# Patient Record
Sex: Male | Born: 1995 | Hispanic: No | Marital: Married | Smoking: Never smoker
Health system: Southern US, Community
[De-identification: ages and names within clinical notes are randomized; demographics above are authoritative.]

## PROBLEM LIST (undated history)

## (undated) DIAGNOSIS — K219 Gastro-esophageal reflux disease without esophagitis: Secondary | ICD-10-CM

## (undated) HISTORY — DX: Gastro-esophageal reflux disease without esophagitis: K21.9

## (undated) HISTORY — PX: WISDOM TOOTH EXTRACTION: SHX21

## (undated) HISTORY — PX: ROTATOR CUFF REPAIR: SHX139

---
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
## 2017-04-07 ENCOUNTER — Ambulatory Visit (INDEPENDENT_AMBULATORY_CARE_PROVIDER_SITE_OTHER): Payer: Managed Care, Other (non HMO) | Admitting: Family Medicine

## 2017-04-07 ENCOUNTER — Encounter: Payer: Self-pay | Admitting: Family Medicine

## 2017-04-07 VITALS — BP 125/89 | HR 116 | Temp 101.1°F | Ht 68.0 in | Wt 153.0 lb

## 2017-04-07 DIAGNOSIS — R509 Fever, unspecified: Secondary | ICD-10-CM

## 2017-04-07 DIAGNOSIS — J101 Influenza due to other identified influenza virus with other respiratory manifestations: Secondary | ICD-10-CM | POA: Diagnosis not present

## 2017-04-07 DIAGNOSIS — F410 Panic disorder [episodic paroxysmal anxiety] without agoraphobia: Secondary | ICD-10-CM

## 2017-04-07 LAB — POCT INFLUENZA A/B
INFLUENZA B, POC: POSITIVE — AB
Influenza A, POC: NEGATIVE

## 2017-04-07 MED ORDER — ZANAMIVIR 5 MG/BLISTER IN AEPB: 2.0000 | INHALATION_SPRAY | Freq: Two times a day (BID) | RESPIRATORY_TRACT | 0 refills | Status: DC

## 2017-04-07 NOTE — Progress Notes (Signed)
Subjective:    Patient ID: Tommy Tapia, male    DOB: [birth year], 21 y.o.   MRN: [id number]  HPI Started feeling back last night with ST, chills sweating, muscle aches. Had temp to 101.4 around 11 AM today.  . Using advil Cold and sinus. Had flu shot about 3 weeks. 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  He feels like his legs and arms just felt weak today. He is here today with his girlfriend. Nobody else has been sick. He's had some mild nasal congestion and some slight cough today.  Review of Systems     Objective:   Physical Exam  Constitutional: He is oriented to person, place, and time. He appears well-developed and well-nourished.  HENT:  Head: Normocephalic and atraumatic.  Right Ear: External ear normal.  Left Ear: External ear normal.  Nose: Nose normal.  Mouth/Throat: Oropharynx is clear and moist.  TMs and canals are clear.   Eyes: Conjunctivae and EOM are normal. Pupils are equal, round, and reactive to light.  Neck: Neck supple. No thyromegaly present.  Cardiovascular: Normal rate and normal heart sounds.  Pulmonary/Chest: Effort normal and breath sounds normal.  Lymphadenopathy:    He has no cervical adenopathy.  Neurological: He is alert and oriented to person, place, and time.  Skin: Skin is warm and dry.  Psychiatric: He has a normal mood and affect.      Assessment & Plan:  Influenza B-we'll treat with Relenza since had hallucinations with Tamiflu. Make sure hydrating well, Tylenol and ibuprofen to control fever. Rest. In good emergency department if symptoms are getting worse.  I did warn that the Relenza can also cause hallucinations of any point he feels like it is affecting how he is feeling or thinking then he needs to just stop it immediately.  Patient started to experience a panic attack before he left today. And that getting him hydroxyzine 10 mg and we had him stay and sit and try to rest for a while. He felt okay enough to walk out  of here and go back home. I also gave him ibuprofen 800 mg and Tylenol 1000 mg help bring down his fever and reduce his achiness. Blood pressure was rechecked and was about the same. Repeat pulse was 108 bpm and repeat temperature was 100F. He stay about 1.5 hours with [place].

## 2017-04-07 NOTE — Patient Instructions (Addendum)
Given 800mg  IBUprofen and 1000mg  of Tylenol at 1:50. Can dose 800mg  of IBUprofen again around 9:50 tonight.  Can dose 1000mg  of Tylenol around 7:50 tonight.      Influenza, Adult Influenza ("the flu") is an infection in the lungs, nose, and throat (respiratory tract). It is caused by a virus. The flu causes many common cold symptoms, as well as a high fever and body aches. It can make you feel very sick. The flu spreads easily from person to person (is contagious). Getting a flu shot (influenza vaccination) every year is the best way to prevent the flu. Follow these instructions at home:  Take over-the-counter and prescription medicines only as told by your doctor.  Use a cool mist humidifier to add moisture (humidity) to the air in your home. This can make it easier to breathe.  Rest as needed.  Drink enough fluid to keep your pee (urine) clear or pale yellow.  Cover your mouth and nose when you cough or sneeze.  Wash your hands with soap and water often, especially after you cough or sneeze. If you cannot use soap and water, use hand sanitizer.  Stay home from work or school as told by your doctor. Unless you are visiting your doctor, try to avoid leaving home until your fever has been gone for 24 hours without the use of medicine.  Keep all follow-up visits as told by your doctor. This is important. How is this prevented?  Getting a yearly (annual) flu shot is the best way to avoid getting the flu. You may get the flu shot in late summer, fall, or winter. Ask your doctor when you should get your flu shot.  Wash your hands often or use hand sanitizer often.  Avoid contact with people who are sick during cold and flu season.  Eat healthy foods.  Drink plenty of fluids.  Get enough sleep.  Exercise regularly. Contact a doctor if:  You get new symptoms.  You have: ? Chest pain. ? Watery poop (diarrhea). ? A fever.  Your cough gets worse.  You start to have more  mucus.  You feel sick to your stomach (nauseous).  You throw up (vomit). Get help right away if:  You start to be short of breath or have trouble breathing.  Your skin or nails turn a bluish color.  You have very bad pain or stiffness in your neck.  You get a sudden headache.  You get sudden pain in your face or ear.  You cannot stop throwing up. This information is not intended to replace advice given to you by your health care provider. Make sure you discuss any questions you have with your health care provider. Document Released: 02/04/2008 Document Revised: 10/03/2015 Document Reviewed: 02/19/2015 Elsevier Interactive Patient Education  2017 [employer]

## 2017-05-31 ENCOUNTER — Ambulatory Visit (INDEPENDENT_AMBULATORY_CARE_PROVIDER_SITE_OTHER): Payer: Managed Care, Other (non HMO) | Admitting: Family Medicine

## 2017-05-31 ENCOUNTER — Ambulatory Visit (INDEPENDENT_AMBULATORY_CARE_PROVIDER_SITE_OTHER): Payer: Managed Care, Other (non HMO)

## 2017-05-31 ENCOUNTER — Encounter: Payer: Self-pay | Admitting: Family Medicine

## 2017-05-31 VITALS — BP 136/80 | HR 91 | Temp 97.6°F | Ht 70.0 in | Wt 164.0 lb

## 2017-05-31 DIAGNOSIS — J0100 Acute maxillary sinusitis, unspecified: Secondary | ICD-10-CM

## 2017-05-31 DIAGNOSIS — M25512 Pain in left shoulder: Secondary | ICD-10-CM | POA: Diagnosis not present

## 2017-05-31 MED ORDER — IPRATROPIUM BROMIDE 0.06 % NA SOLN: 2.0000 | NASAL | 6 refills | Status: DC | PRN

## 2017-05-31 MED ORDER — PREDNISONE 10 MG PO TABS: 30.0000 mg | ORAL_TABLET | Freq: Every day | ORAL | 0 refills | Status: DC

## 2017-05-31 NOTE — Progress Notes (Signed)
Tommy Tapia is a 22 y.o. male who presents to [facility]: Primary Care Sports Medicine today for left shoulder pain and sinus pressure.   Tommy Tapia notes a 1 year history of left shoulder pain. The pain is present at night and with cross over arm activity and with rotating the shoulder when abducted and externally rotated.  He notes the pain has been worsening recently.  He denies any injury.  No radiating pain weakness or numbness fevers chills.  He has not tried any treatment yet.   Additionally he notes a one-week history of sinus pain and pressure associated with a scratchy throat.  Symptoms have worsened recently.  He is tried over-the-counter medications which have helped only a little.  He denies significant cough or shortness of breath.   No past medical history on file. No past surgical history on file. Social History   Tobacco Use  . Smoking status: Never Smoker  . Smokeless tobacco: Never Used  Substance Use Topics  . Alcohol use: No    Alcohol/week: 0.0 oz   family history is not on file.  ROS as above:  Medications: Current Outpatient Medications  Medication Sig Dispense Refill  . ipratropium (ATROVENT) 0.06 % nasal spray Place 2 sprays into both nostrils every 4 (four) hours as needed for rhinitis. 10 mL 6  . predniSONE (DELTASONE) 10 MG tablet Take 3 tablets (30 mg total) by mouth daily with breakfast. 15 tablet 0   No current facility-administered medications for this visit.    Allergies  Allergen Reactions  . Other     MEDICATION DYES  . Tamiflu [Oseltamivir Phosphate] Other (See Comments)    Hallucination    Health Maintenance Health Maintenance  Topic Date Due  . HIV Screening  04/21/2013  . TETANUS/TDAP  08/28/2018  . INFLUENZA VACCINE  Completed     Exam:  BP 136/80   Pulse 91   Temp 97.6 F (36.4 C) (Oral)   Ht 5\' 10"  (1.778 m)   Wt 164 lb  (74.4 kg)   BMI 23.53 kg/m  Gen: Well NAD HEENT: EOMI,  MMM normal posterior pharynx with cobblestoning.  Normal tympanic membranes bilaterally.  Clear nasal discharge. Lungs: Normal work of breathing. CTABL Heart: RRR no MRG Abd: NABS, Soft. Nondistended, Nontender Exts: Brisk capillary refill, warm and well perfused.  MSK: C-spine nontender to midline normal neck motion. Shoulder normal-appearing Tender to palpation AC joint. Normal shoulder motion. Positive crossover arm compression test. Negative Hawkins and Neer's test. Positive full can test negative empty can test. Positive O'Brien's test. Negative Yergason's and speeds test. Shoulder strength is intact. Negative grind and clunk test. Pulses capillary refill and sensation are intact.   No results found for this or any previous visit (from the past 72 hour(s)). Dg Shoulder Left  Result Date: 05/31/2017 CLINICAL DATA:  Left shoulder pain. EXAM: LEFT SHOULDER - 2+ VIEW COMPARISON:  None. FINDINGS: There is no evidence of fracture or dislocation. There is no evidence of arthropathy or other focal bone abnormality. Soft tissues are unremarkable. IMPRESSION: Negative. Electronically Signed   By: Irish Lack M.D.   On: 05/31/2017 14:24      Assessment and Plan: 22 y.o. male with  Sinusitis likely viral.  Based on symptoms failing to improve after reasonable trial of conservative management will treat with prednisone and Atrovent nasal spray.    Shoulder pain unclear etiology concerning for labrum tear versus AC joint issue.  X-ray is unremarkable.  Plan for trial of prednisone as noted above and home exercise program.  If not better likely next step would be injection versus physical therapy versus MRI.  Follow-up with PCP in a few weeks.   Orders Placed This Encounter  Procedures  . DG Shoulder Left    Standing Status:   Future    Number of Occurrences:   1    Standing Expiration Date:   07/30/2018    Order Specific  Question:   Reason for Exam (SYMPTOM  OR DIAGNOSIS REQUIRED)    Answer:   eval left shoulder pain    Order Specific Question:   Preferred imaging location?    [facility]    Order Specific Question:   Radiology Contrast Protocol - do NOT remove file path    Answer:   [email or web address]   Meds ordered this encounter  Medications  . predniSONE (DELTASONE) 10 MG tablet    Sig: Take 3 tablets (30 mg total) by mouth daily with breakfast.    Dispense:  15 tablet    Refill:  0  . ipratropium (ATROVENT) 0.06 % nasal spray    Sig: Place 2 sprays into both nostrils every 4 (four) hours as needed for rhinitis.    Dispense:  10 mL    Refill:  6     Discussed warning signs or symptoms. Please see discharge instructions. Patient expresses understanding.

## 2017-05-31 NOTE — Patient Instructions (Signed)
Thank you for coming in today. Take the prednisone for 5 days.  Use the nasal spray for runny nose.  Do the home exercises.  Up to the front, to the side and internal and external rotation.  Do about 30 reps twice daily.  Get xray now.  Follow up with Dr T in 4 weeks or so.  Continue over the counter cold medicines.   Call or go to the emergency room if you get worse, have trouble breathing, have chest pains, or palpitations.

## 2017-07-05 ENCOUNTER — Encounter: Payer: Self-pay | Admitting: Sports Medicine

## 2017-07-05 ENCOUNTER — Ambulatory Visit (INDEPENDENT_AMBULATORY_CARE_PROVIDER_SITE_OTHER): Payer: Managed Care, Other (non HMO) | Admitting: Sports Medicine

## 2017-07-05 ENCOUNTER — Ambulatory Visit (INDEPENDENT_AMBULATORY_CARE_PROVIDER_SITE_OTHER): Payer: Managed Care, Other (non HMO)

## 2017-07-05 DIAGNOSIS — M5412 Radiculopathy, cervical region: Secondary | ICD-10-CM | POA: Diagnosis not present

## 2017-07-05 DIAGNOSIS — M25512 Pain in left shoulder: Secondary | ICD-10-CM

## 2017-07-05 DIAGNOSIS — B354 Tinea corporis: Secondary | ICD-10-CM

## 2017-07-05 MED ORDER — PREDNISONE 50 MG PO TABS: ORAL_TABLET | ORAL | 0 refills | Status: DC

## 2017-07-05 MED ORDER — CLOTRIMAZOLE-BETAMETHASONE 1-0.05 % EX CREA: 1.0000 "application " | TOPICAL_CREAM | Freq: Two times a day (BID) | CUTANEOUS | 0 refills | Status: DC

## 2017-07-05 NOTE — Assessment & Plan Note (Signed)
Left C7 distribution. Prednisone, rehab exercises, x-rays. Return in 1 month, MR for interventional planning if no better.

## 2017-07-05 NOTE — Assessment & Plan Note (Signed)
Adding topical Lotrisone 

## 2017-07-05 NOTE — Progress Notes (Signed)
  Subjective:    CC: Skin rash, neck and shoulder pain  HPI: Skin rash: Present for a few weeks, there is a circular rash on his left deltoid.  Only minimally pruritic, no pain.  Shoulder pain: Left-sided, works in grocery store handling lots of money, pain is in the neck with radiation to the posterior left scalp as well as down the left arm to the first through third fingers.  Worse with prolonged downgaze.  Moderate, persistent, no progressive weakness, no trauma, no constitutional symptoms.  I reviewed the past medical history, family history, social history, surgical history, and allergies today and no changes were needed.  Please see the problem list section below in epic for further details.  Past Medical History: No past medical history on file. Past Surgical History: No past surgical history on file. Social History: Social History   Socioeconomic History  . Marital status: Single    Spouse name: None  . Number of children: None  . Years of education: None  . Highest education level: None  Social Needs  . Financial resource strain: None  . Food insecurity - worry: None  . Food insecurity - inability: None  . Transportation needs - medical: None  . Transportation needs - non-medical: None  Occupational History  . None  Tobacco Use  . Smoking status: Never Smoker  . Smokeless tobacco: Never Used  Substance and Sexual Activity  . Alcohol use: No    Alcohol/week: 0.0 oz  . Drug use: None  . Sexual activity: None  Other Topics Concern  . None  Social History Narrative  . None   Family History: No family history on file. Allergies: Allergies  Allergen Reactions  . Other     MEDICATION DYES  . Tamiflu [Oseltamivir Phosphate] Other (See Comments)    Hallucination   Medications: See med rec.  Review of Systems: No fevers, chills, night sweats, weight loss, chest pain, or shortness of breath.   Objective:    General: Well Developed, well nourished, and in no  acute distress.  Neuro: Alert and oriented x3, extra-ocular muscles intact, sensation grossly intact.  HEENT: Normocephalic, atraumatic, pupils equal round reactive to light, neck supple, no masses, no lymphadenopathy, thyroid nonpalpable.  Skin: Warm and dry, on the left deltoid there is a circular, scaly leading edged lesion consistent with tinea corporis Cardiac: Regular rate and rhythm, no murmurs rubs or gallops, no lower extremity edema.  Respiratory: Clear to auscultation bilaterally. Not using accessory muscles, speaking in full sentences. Neck: Negative spurling's Full neck range of motion Grip strength and sensation normal in bilateral hands Strength good C4 to T1 distribution No sensory change to C4 to T1 Reflexes normal  Impression and Recommendations:    Radiculitis of left cervical region Left C7 distribution. Prednisone, rehab exercises, x-rays. Return in 1 month, MR for interventional planning if no better.  Tinea corporis Adding topical Lotrisone. ___________________________________________ Ihor Austinhomas J. Benjamin Stainhekkekandam, M.D., ABFM., CAQSM. Primary Care and Sports Medicine [facility]  Adjunct Instructor of Family Medicine  [employer]'

## 2017-08-02 ENCOUNTER — Ambulatory Visit: Payer: Managed Care, Other (non HMO) | Admitting: Sports Medicine

## 2017-08-02 DIAGNOSIS — Z0189 Encounter for other specified special examinations: Secondary | ICD-10-CM

## 2018-05-27 ENCOUNTER — Ambulatory Visit (INDEPENDENT_AMBULATORY_CARE_PROVIDER_SITE_OTHER): Payer: 59 | Admitting: Physician Assistant

## 2018-05-27 ENCOUNTER — Encounter: Payer: Self-pay | Admitting: Physician Assistant

## 2018-05-27 VITALS — BP 143/81 | HR 72 | Temp 97.7°F | Wt 179.0 lb

## 2018-05-27 DIAGNOSIS — R6889 Other general symptoms and signs: Secondary | ICD-10-CM

## 2018-05-27 DIAGNOSIS — J060 Acute laryngopharyngitis: Secondary | ICD-10-CM | POA: Diagnosis not present

## 2018-05-27 LAB — POCT RAPID STREP A (OFFICE): Rapid Strep A Screen: NEGATIVE

## 2018-05-27 LAB — POCT INFLUENZA A/B
Influenza A, POC: NEGATIVE
Influenza B, POC: NEGATIVE

## 2018-05-27 MED ORDER — PREDNISONE 20 MG PO TABS: 40.0000 mg | ORAL_TABLET | Freq: Every day | ORAL | 0 refills | Status: AC

## 2018-05-27 NOTE — Progress Notes (Signed)
HPI:                                                                Tommy Tapia is a 23 y.o. male who presents to [name]: Primary Care Sports Medicine today for sore throat  Sore Throat   This is a new problem. The current episode started today. The problem has been unchanged. Neither side of throat is experiencing more pain than the other. There has been no fever. The pain is moderate. Associated symptoms include swollen glands. Pertinent negatives include no abdominal pain, congestion, coughing, drooling, ear pain, headaches, neck pain, trouble swallowing or vomiting. He has had no exposure to strep. He has tried nothing for the symptoms.  Reports he is in the [employer] and he was [place] this [place], and he received several vaccinations.    History reviewed. No pertinent past medical history. History reviewed. No pertinent surgical history. Social History   Tobacco Use  . Smoking status: Never Smoker  . Smokeless tobacco: Never Used  Substance Use Topics  . Alcohol use: No    Alcohol/week: 0.0 standard drinks   family history is not on file.    ROS: Review of Systems  Constitutional: Positive for diaphoresis and fatigue. Negative for chills and fever.  HENT: Positive for sore throat. Negative for congestion, drooling, ear pain and trouble swallowing.   Respiratory: Negative for cough.   Gastrointestinal: Negative for abdominal pain, nausea and vomiting.  Musculoskeletal: Negative for arthralgias and neck pain.  Skin: Negative for rash.  Neurological: Negative for headaches.     Medications: Current Outpatient Medications  Medication Sig Dispense Refill  . predniSONE (DELTASONE) 20 MG tablet Take 2 tablets (40 mg total) by mouth daily with breakfast for 5 days. 10 tablet 0   No current facility-administered medications for this visit.    Allergies  Allergen Reactions  . Other     MEDICATION DYES  . Tamiflu [Oseltamivir  Phosphate] Other (See Comments)    Hallucination       Objective:  BP (!) 143/81   Pulse 72   Temp 97.7 F (36.5 C) (Oral)   Wt 179 lb (81.2 kg)   SpO2 96%   BMI 25.68 kg/m  Gen:  alert, not ill-appearing, no distress, appropriate for age HEENT: head normocephalic without obvious abnormality, conjunctiva and cornea clear, wearing glasses, tympanic membranes pearly gray and semitransparent bilaterally, external ear canals normal, nasal mucosa pink, oropharynx with mild left-sided redness, no swelling or exudates on the tonsils, uvula midline, neck supple, bilateral tender tonsillar adenopathy, neck supple, trachea midline Pulm: Normal work of breathing, voice is hoarse, clear to auscultation bilaterally, no wheezes, rales or rhonchi CV: Normal rate, regular rhythm, s1 and s2 distinct, no murmurs, clicks or rubs  Neuro: alert and oriented x 3, no tremor MSK: extremities atraumatic, normal gait and station Skin: intact, no rashes on exposed skin, no jaundice, no cyanosis    Results for orders placed or performed in visit on 05/27/18 (from the past 72 hour(s))  POCT Influenza A/B     Status: Normal   Collection Time: 05/27/18  3:21 PM  Result Value Ref Range   Influenza A, POC Negative Negative   Influenza B, POC Negative Negative  POCT  rapid strep A     Status: Normal   Collection Time: 05/27/18  3:28 PM  Result Value Ref Range   Rapid Strep A Screen Negative Negative   No results found.    Assessment and Plan: 23 y.o. male with    .Diagnoses and all orders for this visit:  Flu-like symptoms -     POCT Influenza A/B  Laryngopharyngitis -     predniSONE (DELTASONE) 20 MG tablet; Take 2 tablets (40 mg total) by mouth daily with breakfast for 5 days. -     POCT rapid strep A   Afebrile, no tachypnea, no tachycardia Point-of-care influenza AB and strep a both negative today Counseled on supportive care for viral pharyngitis Work note provided for today  Patient  education and anticipatory guidance given Patient agrees with treatment plan Follow-up as needed if symptoms worsen or fail to improve  Levonne Hubert PA-C

## 2018-05-27 NOTE — Patient Instructions (Addendum)
For sore throat: - Prednisone daily for 5 days (Do not take any OTC NSAIDs during this time such as Aleve, Advil, Ibuprofen etc.) - Tylenol 1000mg  every 8 hours as needed for throat pain - Cepacol throat lozenges and/or Chloraseptic spray - Warm salt water gargles  Pharyngitis  Pharyngitis is redness, pain, and swelling (inflammation) of the throat (pharynx). It is a very common cause of sore throat. Pharyngitis can be caused by a bacteria, but it is usually caused by a virus. Most cases of pharyngitis get better on their own without treatment. What are the causes? This condition may be caused by:  Infection by viruses (viral). Viral pharyngitis spreads from person to person (is contagious) through coughing, sneezing, and sharing of personal items or utensils such as cups, forks, spoons, and toothbrushes.  Infection by bacteria (bacterial). Bacterial pharyngitis may be spread by touching the nose or face after coming in contact with the bacteria, or through more intimate contact, such as kissing.  Allergies. Allergies can cause buildup of mucus in the throat (post-nasal drip), leading to inflammation and irritation. Allergies can also cause blocked nasal passages, forcing breathing through the mouth, which dries and irritates the throat. What increases the risk? You are more likely to develop this condition if:  You are 955-21 years old.  You are exposed to crowded environments such as daycare, school, or dormitory living.  You live in a cold climate.  You have a weakened disease-fighting (immune) system. What are the signs or symptoms? Symptoms of this condition vary by the cause (viral, bacterial, or allergies) and can include:  Sore throat.  Fatigue.  Low-grade fever.  Headache.  Joint pain and muscle aches.  Skin rashes.  Swollen glands in the throat (lymph nodes).  Plaque-like film on the throat or tonsils. This is often a symptom of bacterial  pharyngitis.  Vomiting.  Stuffy nose (nasal congestion).  Cough.  Red, itchy eyes (conjunctivitis).  Loss of appetite. How is this diagnosed? This condition is often diagnosed based on your medical history and a physical exam. Your health care provider will ask you questions about your illness and your symptoms. A swab of your throat may be done to check for bacteria (rapid strep test). Other lab tests may also be done, depending on the suspected cause, but these are rare. How is this treated? This condition usually gets better in 3-4 days without medicine. Bacterial pharyngitis may be treated with antibiotic medicines. Follow these instructions at home:  Take over-the-counter and prescription medicines only as told by your health care provider. ? If you were prescribed an antibiotic medicine, take it as told by your health care provider. Do not stop taking the antibiotic even if you start to feel better. ? Do not give children aspirin because of the association with Reye syndrome.  Drink enough water and fluids to keep your urine clear or pale yellow.  Get a lot of rest.  Gargle with a salt-water mixture 3-4 times a day or as needed. To make a salt-water mixture, completely dissolve -1 tsp of salt in 1 cup of warm water.  If your health care provider approves, you may use throat lozenges or sprays to soothe your throat. Contact a health care provider if:  You have large, tender lumps in your neck.  You have a rash.  You cough up green, yellow-brown, or bloody spit. Get help right away if:  Your neck becomes stiff.  You drool or are unable to swallow liquids.  You  cannot drink or take medicines without vomiting.  You have severe pain that does not go away, even after you take medicine.  You have trouble breathing, and it is not caused by a stuffy nose.  You have new pain and swelling in your joints such as the knees, ankles, wrists, or elbows. Summary  Pharyngitis  is redness, pain, and swelling (inflammation) of the throat (pharynx).  While pharyngitis can be caused by a bacteria, the most common causes are viral.  Most cases of pharyngitis get better on their own without treatment.  Bacterial pharyngitis is treated with antibiotic medicines. This information is not intended to replace advice given to you by your health care provider. Make sure you discuss any questions you have with your health care provider. Document Released: 04/27/2005 Document Revised: 06/02/2016 Document Reviewed: 06/02/2016 Elsevier Interactive Patient Education  2019 [employer].

## 2018-07-28 ENCOUNTER — Ambulatory Visit (INDEPENDENT_AMBULATORY_CARE_PROVIDER_SITE_OTHER): Payer: 59 | Admitting: Sports Medicine

## 2018-07-28 ENCOUNTER — Encounter: Payer: Self-pay | Admitting: Sports Medicine

## 2018-07-28 ENCOUNTER — Other Ambulatory Visit: Payer: Self-pay

## 2018-07-28 DIAGNOSIS — Z8639 Personal history of other endocrine, nutritional and metabolic disease: Secondary | ICD-10-CM | POA: Insufficient documentation

## 2018-07-28 DIAGNOSIS — Z0000 Encounter for general adult medical examination without abnormal findings: Secondary | ICD-10-CM | POA: Insufficient documentation

## 2018-07-28 DIAGNOSIS — J302 Other seasonal allergic rhinitis: Secondary | ICD-10-CM | POA: Diagnosis not present

## 2018-07-28 MED ORDER — CETIRIZINE HCL 10 MG PO TABS: 10.0000 mg | ORAL_TABLET | Freq: Every day | ORAL | 11 refills | Status: DC

## 2018-07-28 NOTE — Assessment & Plan Note (Signed)
Annual physical exam with screening labs today.

## 2018-07-28 NOTE — Assessment & Plan Note (Signed)
Per patient report this was noted on an [employer] physical. We are going to do some confirmatory blood testing.

## 2018-07-28 NOTE — Progress Notes (Signed)
Subjective:    CC: Runny nose  HPI: Tommy Tapia is a healthy, pleasant 23 year old male, he has a runny nose every time this year.  Because of the current drama with the current outbreak his employer has asked him to come and be examined and cleared to return to work.  He takes Zyrtec, and this controls his symptoms extremely well.  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  I reviewed the past medical history, family history, social history, surgical history, and allergies today and no changes were needed.  Please see the problem list section below in epic for further details.  Past Medical History: No past medical history on file. Past Surgical History: No past surgical history on file. Social History: Social History   Socioeconomic History  . Marital status: Single    Spouse name: Not on file  . Number of children: Not on file  . Years of education: Not on file  . Highest education level: Not on file  Occupational History  . Not on file  Social Needs  . Financial resource strain: Not on file  . Food insecurity:    Worry: Not on file    Inability: Not on file  . Transportation needs:    Medical: Not on file    Non-medical: Not on file  Tobacco Use  . Smoking status: Never Smoker  . Smokeless tobacco: Never Used  Substance and Sexual Activity  . Alcohol use: No    Alcohol/week: 0.0 standard drinks  . Drug use: Not on file  . Sexual activity: Not on file  Lifestyle  . Physical activity:    Days per week: Not on file    Minutes per session: Not on file  . Stress: Not on file  Relationships  . Social connections:    Talks on phone: Not on file    Gets together: Not on file    Attends religious service: Not on file    Active member of club or organization: Not on file    Attends meetings of clubs or organizations: Not on file    Relationship status: Not on file   Other Topics Concern  . Not on file  Social History Narrative  . Not on file   Family History: No family history on file. Allergies: Allergies  Allergen Reactions  . Other     MEDICATION DYES  . Tamiflu [Oseltamivir Phosphate] Other (See Comments)    Hallucination   Medications: See med rec.  Review of Systems: No fevers, chills, night sweats, weight loss, chest pain, or shortness of breath.   Objective:    General: Well Developed, well nourished, and in no acute distress.  Neuro: Alert and oriented x3, extra-ocular muscles intact, sensation grossly intact. Cranial nerves II through XII are intact, motor, sensory, and coordinative functions are all intact. HEENT: Normocephalic, atraumatic, pupils equal round reactive to light, neck supple, no masses, no lymphadenopathy, thyroid nonpalpable. Oropharynx, nasopharynx, external ear canals are unremarkable. Skin: Warm and dry, no rashes noted.  Cardiac: Regular rate and rhythm, no murmurs rubs or gallops.  Respiratory: Clear to auscultation bilaterally. Not using accessory muscles, speaking in full sentences.  Abdominal: Soft, nontender, nondistended, positive bowel sounds, no masses, no organomegaly.  Musculoskeletal: Shoulder, elbow, wrist, hip, knee, ankle stable, and with full range of motion.  Impression and Recommendations:    Annual physical exam Annual physical exam with screening labs today.  Seasonal allergic rhinitis Does well with Zyrtec. No evidence of a viral or bacterial illness.  H/O glucose-6-phosphatase deficiency (G6PD) Per patient report this was noted on an [employer] physical. We are going to do some confirmatory blood testing.   ___________________________________________ Ihor Austin. Benjamin Stain, M.D., ABFM., CAQSM. Primary Care and Sports Medicine [facility]  Adjunct Professor of Family Medicine  [employer]

## 2018-07-28 NOTE — Assessment & Plan Note (Addendum)
Does well with Zyrtec. No evidence of a viral or bacterial illness.

## 2018-07-29 LAB — CBC
HCT: 46.2 % (ref 38.5–50.0)
Hemoglobin: 15.8 g/dL (ref 13.2–17.1)
MCH: 30.7 pg (ref 27.0–33.0)
MCHC: 34.2 g/dL (ref 32.0–36.0)
MCV: 89.9 fL (ref 80.0–100.0)
MPV: 12.3 fL (ref 7.5–12.5)
Platelets: 240 Thousand/uL (ref 140–400)
RBC: 5.14 Million/uL (ref 4.20–5.80)
RDW: 11.8 % (ref 11.0–15.0)
WBC: 6.5 10*3/uL (ref 3.8–10.8)

## 2018-07-29 LAB — LIPID PANEL W/REFLEX DIRECT LDL
Cholesterol: 152 mg/dL (ref <200)
HDL: 53 mg/dL (ref >=40)
LDL Cholesterol (Calc): 86 mg/dL
Non-HDL Cholesterol (Calc): 99 mg/dL (ref <130)
Total CHOL/HDL Ratio: 2.9 (calc) (ref <5.0)
Triglycerides: 52 mg/dL (ref <150)

## 2018-07-29 LAB — COMPREHENSIVE METABOLIC PANEL
AG Ratio: 1.6 (calc) (ref 1.0–2.5)
ALT: 20 U/L (ref 9–46)
AST: 26 U/L (ref 10–40)
Albumin: 4.8 g/dL (ref 3.6–5.1)
CO2: 27 mmol/L (ref 20–32)
Calcium: 9.6 mg/dL (ref 8.6–10.3)
Glucose, Bld: 114 mg/dL — ABNORMAL HIGH (ref 65–99)
Potassium: 4.3 mmol/L (ref 3.5–5.3)

## 2018-07-29 LAB — TSH: TSH: 1.12 m[IU]/L (ref 0.40–4.50)

## 2018-07-29 LAB — COMPREHENSIVE METABOLIC PANEL WITH GFR
Alkaline phosphatase (APISO): 59 U/L (ref 36–130)
BUN: 15 mg/dL (ref 7–25)
Chloride: 103 mmol/L (ref 98–110)
Creat: 1.15 mg/dL (ref 0.60–1.35)
Globulin: 3 g/dL (ref 1.9–3.7)
Sodium: 139 mmol/L (ref 135–146)
Total Bilirubin: 1.8 mg/dL — ABNORMAL HIGH (ref 0.2–1.2)
Total Protein: 7.8 g/dL (ref 6.1–8.1)

## 2018-07-29 LAB — GLUCOSE 6 PHOSPHATE DEHYDROGENASE: G-6PDH: 3.5 U/g{Hb} — ABNORMAL LOW (ref 7.0–20.5)

## 2018-07-29 LAB — VITAMIN D 25 HYDROXY (VIT D DEFICIENCY, FRACTURES): Vit D, 25-Hydroxy: 26 ng/mL — ABNORMAL LOW (ref 30–100)

## 2018-07-29 MED ORDER — VITAMIN D (ERGOCALCIFEROL) 1.25 MG (50000 UNIT) PO CAPS: 50000.0000 [IU] | ORAL_CAPSULE | ORAL | 0 refills | Status: DC

## 2018-07-29 NOTE — Addendum Note (Signed)
Addended by: [name] on: 07/29/2018 06:06 AM   Modules accepted: Orders

## 2019-03-08 ENCOUNTER — Emergency Department (INDEPENDENT_AMBULATORY_CARE_PROVIDER_SITE_OTHER)
Admission: EM | Admit: 2019-03-08 | Discharge: 2019-03-08 | Disposition: A | Discharge status: Home / Self Care | Payer: Managed Care, Other (non HMO) | Source: Home / Self Care

## 2019-03-08 ENCOUNTER — Other Ambulatory Visit: Payer: Self-pay

## 2019-03-08 DIAGNOSIS — R05 Cough: Secondary | ICD-10-CM

## 2019-03-08 DIAGNOSIS — R5383 Other fatigue: Secondary | ICD-10-CM

## 2019-03-08 DIAGNOSIS — R6889 Other general symptoms and signs: Secondary | ICD-10-CM

## 2019-03-08 DIAGNOSIS — Z1152 Encounter for screening for COVID-19: Secondary | ICD-10-CM

## 2019-03-08 DIAGNOSIS — J039 Acute tonsillitis, unspecified: Secondary | ICD-10-CM

## 2019-03-08 DIAGNOSIS — Z20828 Contact with and (suspected) exposure to other viral communicable diseases: Secondary | ICD-10-CM

## 2019-03-08 DIAGNOSIS — J029 Acute pharyngitis, unspecified: Secondary | ICD-10-CM | POA: Diagnosis not present

## 2019-03-08 DIAGNOSIS — M791 Myalgia, unspecified site: Secondary | ICD-10-CM | POA: Diagnosis not present

## 2019-03-08 DIAGNOSIS — Z20822 Contact with and (suspected) exposure to covid-19: Secondary | ICD-10-CM

## 2019-03-08 LAB — POCT INFLUENZA A/B
Influenza A, POC: NEGATIVE
Influenza B, POC: NEGATIVE

## 2019-03-08 LAB — POCT RAPID STREP A (OFFICE): Rapid Strep A Screen: NEGATIVE

## 2019-03-08 MED ORDER — PREDNISONE 50 MG PO TABS: 60.0000 mg | ORAL_TABLET | Freq: Once | ORAL | Status: DC

## 2019-03-08 MED ORDER — ACETAMINOPHEN 325 MG PO TABS
650.0000 mg | ORAL_TABLET | Freq: Once | ORAL | Status: AC
  Administered 2019-03-08: 650 mg via ORAL

## 2019-03-08 MED ORDER — PREDNISONE 50 MG PO TABS: 50.0000 mg | ORAL_TABLET | Freq: Every day | ORAL | 0 refills | Status: AC

## 2019-03-08 MED ORDER — PREDNISONE 20 MG PO TABS
40.0000 mg | ORAL_TABLET | Freq: Once | ORAL | Status: AC
  Administered 2019-03-08: 14:00:00 40 mg via ORAL

## 2019-03-08 MED ORDER — IBUPROFEN 600 MG PO TABS: 600.0000 mg | ORAL_TABLET | Freq: Four times a day (QID) | ORAL | 0 refills | Status: DC | PRN

## 2019-03-08 NOTE — Discharge Instructions (Signed)
°  You may take 500mg  acetaminophen every 4-6 hours or in combination with ibuprofen 400-600mg  every 6-8 hours as needed for pain, inflammation, and fever.  Be sure to well hydrated with clear liquids and get at least 8 hours of sleep at night, preferably more while sick.   Please follow up with family medicine in 1 week if needed.  Due to concern for possibly having Covid-19, it is advised that you self-isolate at home until test results come back.  If positive, it is recommended you stay isolated for at least 10 days after symptom onset and 24 hours after last fever without taking medication (whichever is longer).  If you MUST go out, please wear a mask at all times, limit contact with others.   Most results have been coming back within about 2-5 days.   If your results are negative, you will NOT be receiving a phone call. You may check your MyChart account, please see in this packet how to set on up if you do not already have one. There is also an app for phones you can download.   You WILL be notified for POSITIVE results.

## 2019-03-08 NOTE — ED Provider Notes (Signed)
[facility]    CSN: [id number] Arrival date & time: 03/08/19  1210      History   Chief Complaint Chief Complaint  Patient presents with  . Fever  . Generalized Body Aches    HPI Tommy Tapia is a 23 y.o. male.   HPI Tommy Tapia is a 23 y.o. male presenting to UC with c/o not feeling well since yesterday, associated body aches, fatigue, mild congestion, minimal cough, minimal sore throat. Denies fever, chills, n/v/d. Pt states he feels the same way he normally does with the flu every year.  No known sick contacts. Denies n/v/d.  No chest pain or SOB.   History reviewed. No pertinent past medical history.  Patient Active Problem List   Diagnosis Date Noted  . Annual physical exam 07/28/2018  . Seasonal allergic rhinitis 07/28/2018  . 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 No pertinent surgical history.     Home Medications    Prior to Admission medications   Medication Sig Start Date End Date Taking? Authorizing Provider  cetirizine (ZYRTEC) 10 MG tablet Take 1 tablet (10 mg total) by mouth daily. 07/28/18   [name], MD  ibuprofen (ADVIL) 600 MG tablet Take 1 tablet (600 mg total) by mouth every 6 (six) hours as needed. 03/08/19   Lurene Shadow, PA-C  predniSONE (DELTASONE) 50 MG tablet Take 1 tablet (50 mg total) by mouth daily with breakfast for 5 days. 03/08/19 03/13/19  Lurene Shadow, PA-C  Vitamin D, Ergocalciferol, (DRISDOL) 1.25 MG (50000 UT) CAPS capsule Take 1 capsule (50,000 Units total) by mouth every 7 (seven) days. Take for 8 total doses(weeks) 07/29/18   [name], MD    Family History History reviewed. No pertinent family history.  Social History Social History   Tobacco Use  . Smoking status: Never Smoker  . Smokeless tobacco: Never Used  Substance Use Topics  . Alcohol use: No    Alcohol/week: 0.0 standard drinks  . Drug use: Not on file      Allergies   Other and Tamiflu [oseltamivir phosphate]   Review of Systems Review of Systems  Constitutional: Positive for chills, fatigue and fever.  HENT: Positive for congestion and sore throat. Negative for ear pain, trouble swallowing and voice change.   Respiratory: Positive for cough. Negative for shortness of breath.   Cardiovascular: Negative for chest pain and palpitations.  Gastrointestinal: Negative for abdominal pain, diarrhea, nausea and vomiting.  Musculoskeletal: Positive for arthralgias, back pain and myalgias.  Skin: Negative for rash.  Neurological: Positive for headaches. Negative for light-headedness.     Physical Exam Triage Vital Signs ED Triage Vitals  Enc Vitals Group     BP 03/08/19 1228 (!) 164/82     Pulse Rate 03/08/19 1228 (!) 102     Resp 03/08/19 1228 20     Temp 03/08/19 1228 99.2 F (37.3 C)     Temp Source 03/08/19 1228 Oral     SpO2 03/08/19 1228 96 %     Weight 03/08/19 1231 170 lb (77.1 kg)     Height 03/08/19 1231 5\' 9"  (1.753 m)     Head Circumference --      Peak Flow --      Pain Score 03/08/19 1230 7     Pain Loc --      Pain Edu? --      Excl. in [place]? --    No data found.  Updated Vital  Signs BP (!) 164/82 (BP Location: Right Arm)   Pulse (!) 102   Temp 99.2 F (37.3 C) (Oral)   Resp 20   Ht 5\' 9"  (1.753 m)   Wt 170 lb (77.1 kg)   SpO2 96%   BMI 25.10 kg/m   Visual Acuity Right Eye Distance:   Left Eye Distance:   Bilateral Distance:    Right Eye Near:   Left Eye Near:    Bilateral Near:     Physical Exam Vitals signs and nursing note reviewed.  Constitutional:      Appearance: Normal appearance. He is well-developed.  HENT:     Head: Normocephalic and atraumatic.     Right Ear: Tympanic membrane normal.     Left Ear: Tympanic membrane normal.     Nose: Nose normal.     Right Sinus: No maxillary sinus tenderness or frontal sinus tenderness.     Left Sinus: No maxillary sinus tenderness or frontal sinus  tenderness.     Mouth/Throat:     Lips: Pink.     Mouth: Mucous membranes are moist.     Pharynx: Oropharynx is clear. Uvula midline. Posterior oropharyngeal erythema present.     Tonsils: Tonsillar exudate present. No tonsillar abscesses. 3+ on the right. 3+ on the left.  Neck:     Musculoskeletal: Normal range of motion.  Cardiovascular:     Rate and Rhythm: Normal rate and regular rhythm.  Pulmonary:     Effort: Pulmonary effort is normal. No respiratory distress.     Breath sounds: Normal breath sounds. No stridor. No wheezing, rhonchi or rales.  Musculoskeletal: Normal range of motion.  Skin:    General: Skin is warm and dry.  Neurological:     Mental Status: He is alert and oriented to person, place, and time.  Psychiatric:        Behavior: Behavior normal.      UC Treatments / Results  Labs (all labs ordered are listed, but only abnormal results are displayed) Labs Reviewed  NOVEL CORONAVIRUS, NAA  POCT INFLUENZA A/B  POCT RAPID STREP A (OFFICE)    EKG   Radiology No results found.  Procedures Procedures (including critical care time)  Medications Ordered in UC Medications  acetaminophen (TYLENOL) tablet 650 mg (650 mg Oral Given 03/08/19 1341)  predniSONE (DELTASONE) tablet 40 mg (40 mg Oral Given 03/08/19 1342)    Initial Impression / Assessment and Plan / UC Course  I have reviewed the triage vital signs and the nursing notes.  Pertinent labs & imaging results that were available during my care of the patient were reviewed by me and considered in my medical decision making (see chart for details).    Rapid flu and strep: NEGATIVE Covid-19 test pending AVS provided along with work note  Final Clinical Impressions(s) / UC Diagnoses   Final diagnoses:  Flu-like symptoms  Acute tonsillitis, unspecified etiology  Encounter for screening laboratory testing for COVID-19 virus     Discharge Instructions      You may take 500mg  acetaminophen  every 4-6 hours or in combination with ibuprofen 400-600mg  every 6-8 hours as needed for pain, inflammation, and fever.  Be sure to well hydrated with clear liquids and get at least 8 hours of sleep at night, preferably more while sick.   Please follow up with family medicine in 1 week if needed.  Due to concern for possibly having Covid-19, it is advised that you self-isolate at home until test results come  back.  If positive, it is recommended you stay isolated for at least 10 days after symptom onset and 24 hours after last fever without taking medication (whichever is longer).  If you MUST go out, please wear a mask at all times, limit contact with others.   Most results have been coming back within about 2-5 days.   If your results are negative, you will NOT be receiving a phone call. You may check your MyChart account, please see in this packet how to set on up if you do not already have one. There is also an app for phones you can download.   You WILL be notified for POSITIVE results.      ED Prescriptions    Medication Sig Dispense Auth. Provider   predniSONE (DELTASONE) 50 MG tablet Take 1 tablet (50 mg total) by mouth daily with breakfast for 5 days. 5 tablet Waylan RocherPhelps, Riki Berninger O, PA-C   ibuprofen (ADVIL) 600 MG tablet Take 1 tablet (600 mg total) by mouth every 6 (six) hours as needed. 30 tablet Lurene ShadowPhelps, Renate Danh O, [place]-C     PDMP not reviewed this encounter.   Lurene Shadowhelps, Miki Blank O, [place] 03/08/19 1614

## 2019-03-08 NOTE — ED Triage Notes (Signed)
Woke up yesterday not feeling good.  Felt bad all day.  This morning feeling worse.  Started with body aches and fever.

## 2019-03-09 LAB — NOVEL CORONAVIRUS, NAA: SARS-CoV-2, NAA: NOT DETECTED

## 2019-03-21 ENCOUNTER — Telehealth: Payer: Self-pay | Admitting: Sports Medicine

## 2019-03-21 NOTE — Telephone Encounter (Signed)
I really need to discuss with him.  Virtual visits are quick and easy.

## 2019-03-21 NOTE — Telephone Encounter (Signed)
The patients mom called is having some sinus pressure, headache and some sinus drainage. He denies any productive cough.  He has been having the symptoms for over a week. He has not tried anything over the counter. Patient was offered a virtual visit and declined due to work schedule. He was seen in UC the end of last month. Does he need a visit or will you send something in to the pharmacy for him. Please advise.

## 2019-03-21 NOTE — Telephone Encounter (Signed)
Please call this patient to set up a virtual.

## 2019-03-22 ENCOUNTER — Ambulatory Visit (INDEPENDENT_AMBULATORY_CARE_PROVIDER_SITE_OTHER): Payer: Managed Care, Other (non HMO) | Admitting: Physician Assistant

## 2019-03-22 VITALS — Temp 97.5°F | Ht 69.0 in | Wt 170.0 lb

## 2019-03-22 DIAGNOSIS — J029 Acute pharyngitis, unspecified: Secondary | ICD-10-CM

## 2019-03-22 DIAGNOSIS — J0100 Acute maxillary sinusitis, unspecified: Secondary | ICD-10-CM

## 2019-03-22 MED ORDER — AMOXICILLIN-POT CLAVULANATE 875-125 MG PO TABS: 1.0000 | ORAL_TABLET | Freq: Two times a day (BID) | ORAL | 0 refills | Status: DC

## 2019-03-22 MED ORDER — FLUTICASONE PROPIONATE 50 MCG/ACT NA SUSP: 2.0000 | Freq: Every day | NASAL | 1 refills | Status: DC

## 2019-03-22 NOTE — Telephone Encounter (Signed)
I don't think you meant to route this back to me.

## 2019-03-22 NOTE — Telephone Encounter (Signed)
Appointment has been made. Wanted something for today, PCP did not have anything for today. Set up an appointment with Iran Planas, PA. No further questions at this time.

## 2019-03-22 NOTE — Progress Notes (Signed)
Patient ID: Tommy Tapia, male   DOB: 1996/03/03, 23 y.o.   MRN: [id number] .Marland KitchenVirtual Visit via Video Note  I connected with Ewing Fandino on 03/23/19 at  2:40 PM EST by a video enabled telemedicine application and verified that I am speaking with the correct person using two identifiers.  Location: Patient: home Provider: clinic   I discussed the limitations of evaluation and management by telemedicine and the availability of in person appointments. The patient expressed understanding and agreed to proceed.  History of Present Illness: Pt is a 23 yo male who calls into the clinic with sinus pressure, drainage, headache, facial pressure for last 4 days. 2 weeks ago he had fever, chills, body aches and went to UC. He was checked for strep, flu, and covid and all negative. Symptoms resolved with symptomatic care. A few days later he got a ST and now sinus pressure and drainage. No cough, SOB, loss of smell or taste, GI symptoms, fever, body aches. He is taking advil cold and sinus that is helping a little.   .. Active Ambulatory Problems    Diagnosis Date Noted  . Annual physical exam 07/28/2018  . Seasonal allergic rhinitis 07/28/2018  . 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 URI 07/14/2009  . Right knee pain 12/26/2014  . Costochondritis 02/27/2016  . Plica of knee, right 32/35/5732  . Influenza 06/15/2016  . Tinea corporis 07/05/2017  . Radiculitis of left cervical region 07/05/2017   No Additional Past Medical History      Observations/Objective: No acute distress.  No cough on encounter.  No labored breathing.  Normal mood and appearance.   .. Today's Vitals   03/22/19 1426  Temp: (!) 97.5 F (36.4 C)  TempSrc: Oral  Weight: 170 lb (77.1 kg)  Height: 5\' 9"  (1.753 m)   Body mass index is 25.1 kg/m.    Assessment and Plan: Marland KitchenMarland KitchenDiagnoses and all orders for this visit:  Acute  non-recurrent maxillary sinusitis -     amoxicillin-clavulanate (AUGMENTIN) 875-125 MG tablet; Take 1 tablet by mouth 2 (two) times daily. -     fluticasone (FLONASE) 50 MCG/ACT nasal spray; Place 2 sprays into both nostrils daily.  Sore throat   Seems very consisent with secondary sickening and sinusitis. Treated with augmentin and flonase. Due to COVID pandemic needs to be covid tested as well. Discussed 2 locations. Self isolate until results come back. Follow up as needed. Any worsening SOB go to ED.    Follow Up Instructions:    I discussed the assessment and treatment plan with the patient. The patient was provided an opportunity to ask questions and all were answered. The patient agreed with the plan and demonstrated an understanding of the instructions.   The patient was advised to call back or seek an in-person evaluation if the symptoms worsen or if the condition fails to improve as anticipated.    Iran Planas, PA-C

## 2019-03-22 NOTE — Progress Notes (Signed)
Symptoms started Sunday afternoon:   Sinus pressure  Headache Drainage  No cough/ loss of taste/smell/ no SOB  Has tried advil cold and sinus/ robitussin - helping a little

## 2019-03-23 ENCOUNTER — Encounter: Payer: Self-pay | Admitting: Physician Assistant

## 2019-03-26 ENCOUNTER — Encounter: Payer: Self-pay | Admitting: Emergency Medicine

## 2019-03-26 ENCOUNTER — Other Ambulatory Visit: Payer: Self-pay

## 2019-03-26 ENCOUNTER — Emergency Department (INDEPENDENT_AMBULATORY_CARE_PROVIDER_SITE_OTHER)
Admission: EM | Admit: 2019-03-26 | Discharge: 2019-03-26 | Disposition: A | Discharge status: Home / Self Care | Payer: Managed Care, Other (non HMO) | Source: Home / Self Care

## 2019-03-26 DIAGNOSIS — L0100 Impetigo, unspecified: Secondary | ICD-10-CM | POA: Diagnosis not present

## 2019-03-26 DIAGNOSIS — T3695XA Adverse effect of unspecified systemic antibiotic, initial encounter: Secondary | ICD-10-CM

## 2019-03-26 DIAGNOSIS — K521 Toxic gastroenteritis and colitis: Secondary | ICD-10-CM

## 2019-03-26 MED ORDER — MUPIROCIN 2 % EX OINT: TOPICAL_OINTMENT | CUTANEOUS | 0 refills | Status: DC

## 2019-03-26 MED ORDER — DOXYCYCLINE HYCLATE 100 MG PO CAPS: 100.0000 mg | ORAL_CAPSULE | Freq: Two times a day (BID) | ORAL | 0 refills | Status: DC

## 2019-03-26 NOTE — ED Triage Notes (Signed)
Pt here with c/o diarrhea after taking prescribed Amoxicillin for sinus infection 3 dys ago. Took 3 doses when sx's started; reports diarrhea improving. Denies vomiting, abd pain or fever. Also reports worsened rash under left nostril that appeared 2 days ago.

## 2019-03-26 NOTE — ED Provider Notes (Signed)
[name]    CSN: [id number] Arrival date & time: 03/26/19  1119      History   Chief Complaint Chief Complaint  Patient presents with  . Diarrhea  . Rash    HPI Tommy Tapia is a 23 y.o. male.   HPI Tommy Tapia is a 23 y.o. male presenting to UC with c/o diarrhea that started 2 days ago after being on Augmentin for 3 days for a sinus infection. He had 3 episodes of diarrhea the first day and 2 episodes yesterday.  Pt has since stopped taking his antibiotic and the diarrhea has improved but he reports developing a sore under his nose about 2 days ago he would like evaluated today as well.  He has had a decreased appetite but is starting to feel better today. He has not tried anything OTC for his diarrhea. Denies fever, chills, nausea or vomiting.     History reviewed. No pertinent past medical history.  Patient Active Problem List   Diagnosis Date Noted  . Annual physical exam 07/28/2018  . Seasonal allergic rhinitis 07/28/2018  . 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 No pertinent surgical history.     Home Medications    Prior to Admission medications   Medication Sig Start Date End Date Taking? Authorizing Provider  fluticasone (FLONASE) 50 MCG/ACT nasal spray Place 2 sprays into both nostrils daily. 03/22/19  Yes Breeback, Jade L, PA-C  doxycycline (VIBRAMYCIN) 100 MG capsule Take 1 capsule (100 mg total) by mouth 2 (two) times daily. One po bid x 7 days 03/26/19   Noe Gens, PA-C  mupirocin ointment Drue Stager) 2 % Apply to wound 3 times daily for 5 days 03/26/19   [name]    Family History History reviewed. No pertinent family history.  Social History Social History   Tobacco Use  . Smoking status: Never Smoker  . Smokeless tobacco: Never Used  Substance Use Topics  . Alcohol use: No    Alcohol/week: 0.0 standard drinks  . Drug use: Not on file     Allergies    Augmentin [amoxicillin-pot clavulanate], Tamiflu [oseltamivir phosphate], and Other   Review of Systems Review of Systems  Constitutional: Negative for chills and fever.  HENT: Positive for congestion and sinus pressure. Negative for ear pain, sore throat, trouble swallowing and voice change.   Respiratory: Negative for cough and shortness of breath.   Cardiovascular: Negative for chest pain and palpitations.  Gastrointestinal: Positive for diarrhea. Negative for abdominal pain, blood in stool, nausea and vomiting.  Musculoskeletal: Negative for arthralgias, back pain and myalgias.  Skin: Positive for color change and rash.  Neurological: Positive for headaches (mild frontal). Negative for dizziness and light-headedness.     Physical Exam Triage Vital Signs ED Triage Vitals [03/26/19 1143]  Enc Vitals Group     BP 120/71     Pulse Rate 80     Resp      Temp 98.5 F (36.9 C)     Temp Source Oral     SpO2 97 %     Weight 178 lb 6.4 oz (80.9 kg)     Height 5\' 10"  (1.778 m)     Head Circumference      Peak Flow      Pain Score 0     Pain Loc      Pain Edu?      Excl. in [place]?    No data found.  Updated  Vital Signs BP 120/71 (BP Location: Right Arm)   Pulse 80   Temp 98.5 F (36.9 C) (Oral)   Ht 5\' 10"  (1.778 m)   Wt 178 lb 6.4 oz (80.9 kg)   SpO2 97%   BMI 25.60 kg/m   Visual Acuity Right Eye Distance:   Left Eye Distance:   Bilateral Distance:    Right Eye Near:   Left Eye Near:    Bilateral Near:     Physical Exam Vitals signs and nursing note reviewed.  Constitutional:      General: He is not in acute distress.    Appearance: Normal appearance. He is well-developed. He is not ill-appearing, toxic-appearing or diaphoretic.  HENT:     Head: Normocephalic and atraumatic.      Right Ear: Tympanic membrane and ear canal normal.     Left Ear: Tympanic membrane and ear canal normal.     Nose: Nose normal.     Right Sinus: No maxillary sinus tenderness or  frontal sinus tenderness.     Left Sinus: No maxillary sinus tenderness or frontal sinus tenderness.     Mouth/Throat:     Lips: Pink.     Mouth: Mucous membranes are moist.     Pharynx: Oropharynx is clear. Uvula midline.  Neck:     Musculoskeletal: Normal range of motion.  Cardiovascular:     Rate and Rhythm: Normal rate and regular rhythm.  Pulmonary:     Effort: Pulmonary effort is normal. No respiratory distress.     Breath sounds: Normal breath sounds. No stridor. No wheezing, rhonchi or rales.  Musculoskeletal: Normal range of motion.  Skin:    General: Skin is warm and dry.  Neurological:     Mental Status: He is alert and oriented to person, place, and time.  Psychiatric:        Behavior: Behavior normal.      UC Treatments / Results  Labs (all labs ordered are listed, but only abnormal results are displayed) Labs Reviewed - No data to display  EKG   Radiology No results found.  Procedures Procedures (including critical care time)  Medications Ordered in UC Medications - No data to display  Initial Impression / Assessment and Plan / UC Course  I have reviewed the triage vital signs and the nursing notes.  Pertinent labs & imaging results that were available during my care of the patient were reviewed by me and considered in my medical decision making (see chart for details).     Due to only completing half of his Augmentin for sinus infection, and exam c/w impetigo, will change pt to doxycycline and add mupirocin for the skin infection AVS provided  Final Clinical Impressions(s) / UC Diagnoses   Final diagnoses:  Impetigo  Antibiotic-associated diarrhea     Discharge Instructions      Please take antibiotics as prescribed and be sure to complete entire course even if you start to feel better to ensure infection does not come back. Take medication with food to at least 30 minutes after eating to help prevent stomach upset. You may try also eating  more yogurt or taking probiotic supplements to help with stomach upset while taking antibiotics.   Please follow up with family medicine in 1 week if not improving.    ED Prescriptions    Medication Sig Dispense Auth. Provider   doxycycline (VIBRAMYCIN) 100 MG capsule Take 1 capsule (100 mg total) by mouth 2 (two) times daily. One po bid  x 7 days 14 capsule Doroteo GlassmanPhelps, Genevra Orne O, PA-C   mupirocin ointment (BACTROBAN) 2 % Apply to wound 3 times daily for 5 days 22 g Lurene ShadowPhelps, Brynnley Dayrit O, [place]-C     PDMP not reviewed this encounter.   Lurene Shadowhelps, Elward Nocera O, PA-C 03/26/19 1325

## 2019-03-26 NOTE — Discharge Instructions (Signed)
°  Please take antibiotics as prescribed and be sure to complete entire course even if you start to feel better to ensure infection does not come back. Take medication with food to at least 30 minutes after eating to help prevent stomach upset. You may try also eating more yogurt or taking probiotic supplements to help with stomach upset while taking antibiotics.   Please follow up with family medicine in 1 week if not improving.

## 2019-03-31 ENCOUNTER — Encounter: Payer: Self-pay | Admitting: Sports Medicine

## 2019-03-31 ENCOUNTER — Ambulatory Visit (INDEPENDENT_AMBULATORY_CARE_PROVIDER_SITE_OTHER): Payer: Managed Care, Other (non HMO) | Admitting: Sports Medicine

## 2019-03-31 DIAGNOSIS — Z20828 Contact with and (suspected) exposure to other viral communicable diseases: Secondary | ICD-10-CM

## 2019-03-31 DIAGNOSIS — Z20822 Contact with and (suspected) exposure to covid-19: Secondary | ICD-10-CM | POA: Insufficient documentation

## 2019-03-31 DIAGNOSIS — L731 Pseudofolliculitis barbae: Secondary | ICD-10-CM | POA: Insufficient documentation

## 2019-03-31 NOTE — Assessment & Plan Note (Signed)
Mild pseudofolliculitis barbae, he is in the [employer] and is required to clean shave, he does need a letter giving him a medical exception for this.

## 2019-03-31 NOTE — Assessment & Plan Note (Signed)
Possible Covid exposure, adding Covid antibody testing, his initial Covid PCR swab was negative.

## 2019-03-31 NOTE — Progress Notes (Signed)
Virtual Visit via WebEx/MyChart   I connected with  Tommy Tapia  on 03/31/19 via WebEx/MyChart/Doximity Video and verified that I am speaking with the correct person using two identifiers.   I discussed the limitations, risks, security and privacy concerns of performing an evaluation and management service by WebEx/MyChart/Doximity Video, including the higher likelihood of inaccurate diagnosis and treatment, and the availability of in person appointments.  We also discussed the likely need of an additional face to face encounter for complete and high quality delivery of care.  I also discussed with the patient that there may be a patient responsible charge related to this service. The patient expressed understanding and wishes to proceed.  Provider location is either at home or medical facility. Patient location is at their home, different from provider location. People involved in care of the patient during this telehealth encounter were myself, my nurse/medical assistant, and my front office/scheduling team member.  Subjective:    CC: Facial rash  HPI: Tommy Tapia was recently diagnosed with impetigo, he has topical mupirocin for this, improving.  He had a recent viral illness, tested negative a day after onset of symptoms but wonders if he may have had Covid, he is interested in antibody testing.  Facial rash: Localized on the neck and cheeks where he shaves, he is in the [employer] and is required to keep cleanly shaven, he was wondering if he can have a medical exception for this.  I reviewed the past medical history, family history, social history, surgical history, and allergies today and no changes were needed.  Please see the problem list section below in epic for further details.  Past Medical History: No past medical history on file. Past Surgical History: No past surgical history on file. Social History: Social History   Socioeconomic History  . Marital status: Single     Spouse name: Not on file  . Number of children: Not on file  . Years of education: Not on file  . Highest education level: Not on file  Occupational History  . Not on file  Social Needs  . Financial resource strain: Not on file  . Food insecurity    Worry: Not on file    Inability: Not on file  . Transportation needs    Medical: Not on file    Non-medical: Not on file  Tobacco Use  . Smoking status: Never Smoker  . Smokeless tobacco: Never Used  Substance and Sexual Activity  . Alcohol use: No    Alcohol/week: 0.0 standard drinks  . Drug use: Not on file  . Sexual activity: Not on file  Lifestyle  . Physical activity    Days per week: Not on file    Minutes per session: Not on file  . Stress: Not on file  Relationships  . Social Musician on phone: Not on file    Gets together: Not on file    Attends religious service: Not on file    Active member of club or organization: Not on file    Attends meetings of clubs or organizations: Not on file    Relationship status: Not on file  Other Topics Concern  . Not on file  Social History Narrative  . Not on file   Family History: No family history on file. Allergies: Allergies  Allergen Reactions  . Augmentin [Amoxicillin-Pot Clavulanate] Diarrhea  . Tamiflu [Oseltamivir Phosphate] Other (See Comments)    Hallucination  . Other     MEDICATION  DYES   Medications: See med rec.  Review of Systems: No fevers, chills, night sweats, weight loss, chest pain, or shortness of breath.   Objective:    General: Speaking full sentences, no audible heavy breathing.  Sounds alert and appropriately interactive.  Appears well.  Face symmetric.  Extraocular movements intact.  Pupils equal and round.  No nasal flaring or accessory muscle use visualized.  No other physical exam performed due to the non-physical nature of this visit.  Impression and Recommendations:    Exposure to COVID-19 virus Possible Covid exposure,  adding Covid antibody testing, his initial Covid PCR swab was negative.  Pseudofolliculitis barbae Mild pseudofolliculitis barbae, he is in the [employer] and is required to clean shave, he does need a letter giving him a medical exception for this.  I discussed the above assessment and treatment plan with the patient. The patient was provided an opportunity to ask questions and all were answered. The patient agreed with the plan and demonstrated an understanding of the instructions.   The patient was advised to call back or seek an in-person evaluation if the symptoms worsen or if the condition fails to improve as anticipated.   I provided 25 minutes of non-face-to-face time during this encounter, 15 minutes of additional time was needed to gather information, review chart, records, communicate/coordinate with staff remotely, troubleshooting the multiple errors that we get every time when trying to do video calls through the electronic medical record, WebEx, and Doximity, restart the encounter multiple times due to instability of the software, as well as complete documentation.   ___________________________________________ Gwen Her. Dianah Field, M.D., ABFM., CAQSM. Primary Care and Sports Medicine [facility]  Adjunct Professor of [employer]

## 2019-07-26 ENCOUNTER — Telehealth (INDEPENDENT_AMBULATORY_CARE_PROVIDER_SITE_OTHER): Payer: Managed Care, Other (non HMO) | Admitting: Sports Medicine

## 2019-07-26 ENCOUNTER — Encounter: Payer: Self-pay | Admitting: Sports Medicine

## 2019-07-26 DIAGNOSIS — R1310 Dysphagia, unspecified: Secondary | ICD-10-CM

## 2019-07-26 MED ORDER — ESOMEPRAZOLE MAGNESIUM 40 MG PO CPDR: 40.0000 mg | DELAYED_RELEASE_CAPSULE | Freq: Every day | ORAL | 3 refills | Status: DC

## 2019-07-26 NOTE — Progress Notes (Addendum)
   Virtual Visit via WebEx/MyChart   I connected with  Rafael Salway  on 08/05/19 via WebEx/MyChart/Doximity Video and verified that I am speaking with the correct person using two identifiers.   I discussed the limitations, risks, security and privacy concerns of performing an evaluation and management service by WebEx/MyChart/Doximity Video, including the higher likelihood of inaccurate diagnosis and treatment, and the availability of in person appointments.  We also discussed the likely need of an additional face to face encounter for complete and high quality delivery of care.  I also discussed with the patient that there may be a patient responsible charge related to this service. The patient expressed understanding and wishes to proceed.  Provider location is either at home or medical facility. Patient location is at their home, different from provider location. People involved in care of the patient during this telehealth encounter were myself, my nurse/medical assistant, and my front office/scheduling team member.  Review of Systems: No fevers, chills, night sweats, weight loss, chest pain, or shortness of breath.   Objective Findings:    General: Speaking full sentences, no audible heavy breathing.  Sounds alert and appropriately interactive.  Appears well.  Face symmetric.  Extraocular movements intact.  Pupils equal and round.  No nasal flaring or accessory muscle use visualized.  Independent interpretation of tests performed by another provider:   None.  Impression and Recommendations:    Dysphagia For months to years this pleasant 24 year old male has had difficulty swallowing foods, he endorses a fullness in his retrosternal region when swallowing, generally solid foods and not so much liquids. He often feels like food will also get stuck in his upper throat. He does get significant acid reflux symptoms as well. No melena, hematochezia, hematemesis. No constitutional  symptoms. Adding Nexium 40 mg daily, and I would like a modified barium swallow evaluation to evaluate his swallow function from a functional and structural perspective. We will follow up in a month after this.  Modified barium swallow evaluation is unremarkable, patient continues to complain of progressive dysphagia, as we do need to evaluate for lower esophageal component I would like a referral to gastroenterology.   I discussed the above assessment and treatment plan with the patient. The patient was provided an opportunity to ask questions and all were answered. The patient agreed with the plan and demonstrated an understanding of the instructions.   The patient was advised to call back or seek an in-person evaluation if the symptoms worsen or if the condition fails to improve as anticipated.   I provided 30 minutes of face to face and non-face-to-face time during this encounter date, time was needed to gather information, review chart, records, communicate/coordinate with staff remotely, as well as complete documentation.   ___________________________________________ Ihor Austin. Benjamin Stain, M.D., ABFM., CAQSM. Primary Care and Sports Medicine [facility]  Adjunct Instructor of Family Medicine  [employer]

## 2019-07-26 NOTE — Assessment & Plan Note (Addendum)
For months to years this pleasant 24 year old male has had difficulty swallowing foods, he endorses a fullness in his retrosternal region when swallowing, generally solid foods and not so much liquids. He often feels like food will also get stuck in his upper throat. He does get significant acid reflux symptoms as well. No melena, hematochezia, hematemesis. No constitutional symptoms. Adding Nexium 40 mg daily, and I would like a modified barium swallow evaluation to evaluate his swallow function from a functional and structural perspective. We will follow up in a month after this.  Modified barium swallow evaluation is unremarkable, patient continues to complain of progressive dysphagia, as we do need to evaluate for lower esophageal component I would like a referral to gastroenterology.

## 2019-07-27 ENCOUNTER — Other Ambulatory Visit (HOSPITAL_COMMUNITY): Payer: Self-pay

## 2019-07-27 DIAGNOSIS — R1310 Dysphagia, unspecified: Secondary | ICD-10-CM

## 2019-08-03 ENCOUNTER — Ambulatory Visit (HOSPITAL_COMMUNITY)
Admission: RE | Admit: 2019-08-03 | Discharge: 2019-08-03 | Disposition: A | Discharge status: Home / Self Care | Payer: Managed Care, Other (non HMO) | Source: Ambulatory Visit | Attending: Sports Medicine | Admitting: Sports Medicine

## 2019-08-03 ENCOUNTER — Other Ambulatory Visit: Payer: Self-pay

## 2019-08-03 DIAGNOSIS — R1310 Dysphagia, unspecified: Secondary | ICD-10-CM | POA: Insufficient documentation

## 2019-08-05 NOTE — Addendum Note (Signed)
Addended by: [name] on: 08/05/2019 09:10 AM   Modules accepted: Orders

## 2019-08-07 ENCOUNTER — Encounter: Payer: Self-pay | Admitting: Gastroenterology

## 2019-09-01 ENCOUNTER — Other Ambulatory Visit: Payer: Self-pay

## 2019-09-01 ENCOUNTER — Ambulatory Visit (INDEPENDENT_AMBULATORY_CARE_PROVIDER_SITE_OTHER): Payer: 59 | Admitting: Gastroenterology

## 2019-09-01 ENCOUNTER — Encounter: Payer: Self-pay | Admitting: Gastroenterology

## 2019-09-01 VITALS — BP 116/62 | HR 66 | Temp 97.8°F | Ht 70.0 in | Wt 189.5 lb

## 2019-09-01 DIAGNOSIS — K219 Gastro-esophageal reflux disease without esophagitis: Secondary | ICD-10-CM | POA: Diagnosis not present

## 2019-09-01 DIAGNOSIS — Z01818 Encounter for other preprocedural examination: Secondary | ICD-10-CM | POA: Diagnosis not present

## 2019-09-01 DIAGNOSIS — R1310 Dysphagia, unspecified: Secondary | ICD-10-CM

## 2019-09-01 MED ORDER — ESOMEPRAZOLE MAGNESIUM 40 MG PO CPDR: 40.0000 mg | DELAYED_RELEASE_CAPSULE | Freq: Every day | ORAL | 3 refills | Status: DC

## 2019-09-01 NOTE — Progress Notes (Signed)
Chief Complaint: Dysphagia, GERD  Referring Provider:     Silverio Decamp, MD   HPI:    Tommy Tapia is a 24 y.o. male in the [place] referred to the [facility]ology Clinic for evaluation of dysphagia and reflux symptoms.  Reflux symptoms characterized by heartburn, regurgitation, sour brash, increased belching.   Sxs present for years, and progressively worsening. Worse with onions, peppers. No nocturnal sxs. Trialed OTC antacids (Tums) with improvement initially, but decreasing efficacy lately.   Dysphagia to solids, pointing to mid sternum. Will occasionally have to regurgitate food back out. No food impactions. No weight loss, night sweats, fever, chills.   No prior EGD.   Was evaluated by his [facility] physician on 07/26/2019, and prescribed Nexium 40 mg/day along with modified barium swallow (normal) and referred to GI.  He has not yet picked up/started Nexium (was inadvertently sent to former pharmacy and no longer lives in that area).  -MBS (08/03/2019): Normal oropharyngeal swallow.  Barium retention on esophageal sleep.  Barium tablet passed easily into the stomach  No known family history of CRC, GI malignancy, liver disease, pancreatic disease, or IBD.  Scheduled to leave for [place] at the end of May.   Past Medical History:  Diagnosis Date  . GERD (gastroesophageal reflux disease)      History reviewed. No pertinent surgical history. Family History  Problem Relation Age of Onset  . Colon cancer Neg Hx   . Esophageal cancer Neg Hx    Social History   Tobacco Use  . Smoking status: Never Smoker  . Smokeless tobacco: Never Used  Substance Use Topics  . Alcohol use: No    Alcohol/week: 0.0 standard drinks  . Drug use: Not Currently   Current Outpatient Medications  Medication Sig Dispense Refill  . esomeprazole (NEXIUM) 40 MG capsule Take 1 capsule (40 mg total) by mouth daily. (Patient not taking: Reported on 09/01/2019) 30  capsule 3   No current facility-administered medications for this visit.   Allergies  Allergen Reactions  . Augmentin [Amoxicillin-Pot Clavulanate] Diarrhea  . Tamiflu [Oseltamivir Phosphate] Other (See Comments)    Hallucination  . Other     MEDICATION DYES     Review of Systems: All systems reviewed and negative except where noted in HPI.     Physical Exam:    Wt Readings from Last 3 Encounters:  09/01/19 189 lb 8 oz (86 kg)  03/26/19 178 lb 6.4 oz (80.9 kg)  03/22/19 170 lb (77.1 kg)    BP 116/62   Pulse 66   Temp 97.8 F (36.6 C)   Ht 5\' 10"  (1.778 m)   Wt 189 lb 8 oz (86 kg)   BMI 27.19 kg/m  Constitutional:  Pleasant, in no acute distress. Psychiatric: Normal mood and affect. Behavior is normal. EENT: Pupils normal.  Conjunctivae are normal. No scleral icterus. Neck supple. No cervical LAD. Cardiovascular: Normal rate, regular rhythm. No edema Pulmonary/chest: Effort normal and breath sounds normal. No wheezing, rales or rhonchi. Abdominal: Soft, nondistended, nontender. Bowel sounds active throughout. There are no masses palpable. No hepatomegaly. Neurological: Alert and oriented to person place and time. Skin: Skin is warm and dry. No rashes noted.   ASSESSMENT AND PLAN;   1) GERD 2) Dysphagia  - EGD with dilation -Evaluate for LES laxity, hiatal hernia, erosive esophagitis, etc. at the time of EGD - Start Nexium 40 mg/day, taken 30-60 mins prior to  lunch (doesn't tend to eat breakfast) -Resume antireflux lifestyle/dietary modifications -Additional recommendations pending endoscopic findings  The indications, risks, and benefits of EGD with dilation were explained to the patient in detail. Risks include but are not limited to bleeding, perforation, adverse reaction to medications, and cardiopulmonary compromise. Sequelae include but are not limited to the possibility of surgery, hositalization, and mortality. The patient verbalized understanding and  wished to proceed. All questions answered, referred to scheduler. Further recommendations pending results of the exam.    Shellia Cleverly, DO, FACG  09/01/2019, 10:26 AM   [name],*

## 2019-09-01 NOTE — Patient Instructions (Signed)
You have been scheduled for an endoscopy. Please follow written instructions given to you at your visit today. If you use inhalers (even only as needed), please bring them with you on the day of your procedure. Your physician has requested that you go to www.startemmi.com and enter the access code given to you at your visit today. This web site gives a general overview about your procedure. However, you should still follow specific instructions given to you by our office regarding your preparation for the procedure.  We have sent the following medications to your pharmacy for you to pick up at your convenience: Nexium  It was a pleasure to see you today!  Vito Cirigliano, D.O.

## 2019-09-11 ENCOUNTER — Encounter: Payer: Self-pay | Admitting: Gastroenterology

## 2019-09-20 ENCOUNTER — Other Ambulatory Visit: Payer: Self-pay | Admitting: Gastroenterology

## 2019-09-20 ENCOUNTER — Other Ambulatory Visit: Payer: Self-pay

## 2019-09-20 ENCOUNTER — Ambulatory Visit (INDEPENDENT_AMBULATORY_CARE_PROVIDER_SITE_OTHER): Payer: 59

## 2019-09-20 DIAGNOSIS — Z1159 Encounter for screening for other viral diseases: Secondary | ICD-10-CM

## 2019-09-20 LAB — SARS CORONAVIRUS 2 (TAT 6-24 HRS): SARS Coronavirus 2: NEGATIVE

## 2019-09-22 ENCOUNTER — Other Ambulatory Visit: Payer: Self-pay

## 2019-09-22 ENCOUNTER — Encounter: Payer: Self-pay | Admitting: Gastroenterology

## 2019-09-22 ENCOUNTER — Ambulatory Visit (AMBULATORY_SURGERY_CENTER): Payer: 59 | Admitting: Gastroenterology

## 2019-09-22 VITALS — BP 121/67 | HR 71 | Temp 96.8°F | Resp 16 | Ht 70.0 in | Wt 189.0 lb

## 2019-09-22 DIAGNOSIS — K219 Gastro-esophageal reflux disease without esophagitis: Secondary | ICD-10-CM | POA: Diagnosis present

## 2019-09-22 DIAGNOSIS — R1310 Dysphagia, unspecified: Secondary | ICD-10-CM | POA: Diagnosis not present

## 2019-09-22 DIAGNOSIS — K222 Esophageal obstruction: Secondary | ICD-10-CM | POA: Diagnosis not present

## 2019-09-22 DIAGNOSIS — R1319 Other dysphagia: Secondary | ICD-10-CM

## 2019-09-22 DIAGNOSIS — K228 Other specified diseases of esophagus: Secondary | ICD-10-CM | POA: Diagnosis not present

## 2019-09-22 MED ORDER — SODIUM CHLORIDE 0.9 % IV SOLN: 500.0000 mL | Freq: Once | INTRAVENOUS | Status: DC

## 2019-09-22 NOTE — Progress Notes (Signed)
Report to PACU, RN, vss, BBS= Clear.  

## 2019-09-22 NOTE — Progress Notes (Signed)
Called to room to assist during endoscopic procedure.  Patient ID and intended procedure confirmed with present staff. Received instructions for my participation in the procedure from the performing physician.  

## 2019-09-22 NOTE — Progress Notes (Signed)
Temp by JB and vitals by KA 

## 2019-09-22 NOTE — Patient Instructions (Signed)
YOU HAD AN ENDOSCOPIC PROCEDURE TODAY AT THE [facility]:   Refer to the procedure report that was given to you for any specific questions about what was found during the examination.  If the procedure report does not answer your questions, please call your gastroenterologist to clarify.  If you requested that your care partner not be given the details of your procedure findings, then the procedure report has been included in a sealed envelope for you to review at your convenience later.  YOU SHOULD EXPECT: Some feelings of bloating in the abdomen. Passage of more gas than usual.  Walking can help get rid of the air that was put into your GI tract during the procedure and reduce the bloating. If you had a lower endoscopy (such as a colonoscopy or flexible sigmoidoscopy) you may notice spotting of blood in your stool or on the toilet paper. If you underwent a bowel prep for your procedure, you may not have a normal bowel movement for a few days.  Please Note:  You might notice some irritation and congestion in your nose or some drainage.  This is from the oxygen used during your procedure.  There is no need for concern and it should clear up in a day or so.  SYMPTOMS TO REPORT IMMEDIATELY:     Following upper endoscopy (EGD)  Vomiting of blood or coffee ground material  New chest pain or pain under the shoulder blades  Painful or persistently difficult swallowing  New shortness of breath  Fever of 100F or higher  Black, tarry-looking stools  For urgent or emergent issues, a gastroenterologist can be reached at any hour by calling [phone]. Do not use MyChart messaging for urgent concerns.    DIET: Follow Dilation handout.  ACTIVITY:  You should plan to take it easy for the rest of today and you should NOT DRIVE or use heavy machinery until tomorrow (because of the sedation medicines used during the test).    FOLLOW UP: Our staff will call the number listed on your  records 48-72 hours following your procedure to check on you and address any questions or concerns that you may have regarding the information given to you following your procedure. If we do not reach you, we will leave a message.  We will attempt to reach you two times.  During this call, we will ask if you have developed any symptoms of COVID 19. If you develop any symptoms (ie: fever, flu-like symptoms, shortness of breath, cough etc.) before then, please call [phone].  If you test positive for Covid 19 in the 2 weeks post procedure, please call and report this information to [place].    If any biopsies were taken you will be contacted by phone or by letter within the next 1-3 weeks.  Please call us at [phone] if you have not heard about the biopsies in 3 weeks.    SIGNATURES/CONFIDENTIALITY: You and/or your care partner have signed paperwork which will be entered into your electronic medical record.  These signatures attest to the fact that that the information above on your After Visit Summary has been reviewed and is understood.  Full responsibility of the confidentiality of this discharge information lies with you and/or your care-partner.   Resume medications as instructed for titration. Information given on stricture and dilation diet.

## 2019-09-22 NOTE — Op Note (Addendum)
[facility] Patient Name: Tommy Tapia Procedure Date: 09/22/2019 2:27 PM MRN: [id number] Endoscopist: Doristine Locks , MD Age: 24 Referring MD:  Date of Birth: Jul 01, 1995 Gender: Male Account #: [id number] Procedure:                Upper GI endoscopy Indications:              Dysphagia, Suspected esophageal reflux Medicines:                Monitored Anesthesia Care Procedure:                Pre-Anesthesia Assessment:                           - Prior to the procedure, a History and Physical                            was performed, and patient medications and                            allergies were reviewed. The patient's tolerance of                            previous anesthesia was also reviewed. The risks                            and benefits of the procedure and the sedation                            options and risks were discussed with the patient.                            All questions were answered, and informed consent                            was obtained. Prior Anticoagulants: The patient has                            taken no previous anticoagulant or antiplatelet                            agents. ASA Grade Assessment: II - A patient with                            mild systemic disease. After reviewing the risks                            and benefits, the patient was deemed in                            satisfactory condition to undergo the procedure.                           After obtaining informed consent, the endoscope was  passed under direct vision. Throughout the                            procedure, the patient's blood pressure, pulse, and                            oxygen saturations were monitored continuously. The                            Endoscope was introduced through the mouth, and                            advanced to the second part of duodenum. The upper                            GI  endoscopy was accomplished without difficulty.                            The patient tolerated the procedure well. Scope In: Scope Out: Findings:                 A mild Schatzki ring was found in the lower third                            of the esophagus. The scope was withdrawn. Dilation                            was performed with a Maloney dilator with mild                            resistance at 54 Fr. The dilation site was examined                            following endoscope reinsertion and showed no                            bleeding, mucosal tear or perforation. This was                            then biopsied with a cold forceps for further                            fracturing of the ring. Estimated blood loss was                            minimal.                           The Z-line was regular and was found 42 cm from the                            incisors.  The upper third of the esophagus, middle third of                            the esophagus and lower third of the esophagus were                            normal. Biopsies were obtained from the proximal                            and distal esophagus with cold forceps for                            histology of suspected eosinophilic esophagitis.                           The gastroesophageal flap valve was visualized                            endoscopically and classified as Hill Grade II                            (fold present, opens with respiration).                           The entire examined stomach was normal.                           The duodenal bulb, first portion of the duodenum                            and second portion of the duodenum were normal. Complications:            No immediate complications. Estimated Blood Loss:     Estimated blood loss was minimal. Impression:               - Mild Schatzki ring. Dilated. Biopsied.                           - Z-line  regular, 42 cm from the incisors.                           - Normal upper third of esophagus, middle third of                            esophagus and lower third of esophagus. Biopsied.                           - Gastroesophageal flap valve classified as Hill                            Grade II (fold present, opens with respiration).                           - Normal stomach.                           -  Normal duodenal bulb, first portion of the                            duodenum and second portion of the duodenum. Recommendation:           - Patient has a contact number available for                            emergencies. The signs and symptoms of potential                            delayed complications were discussed with the                            patient. Return to normal activities tomorrow.                            Written discharge instructions were provided to the                            patient.                           - Resume previous diet.                           - Continue present medications.                           - Await pathology results.                           - Return to GI clinic PRN.                           - Continue titrating acid suppression medication to                            the lowest effective dose. Doristine Locks, MD 09/22/2019 3:00:46 PM

## 2019-09-26 ENCOUNTER — Telehealth: Payer: Self-pay

## 2019-09-26 NOTE — Telephone Encounter (Signed)
  Follow up Call-  Call back number 09/22/2019  Post procedure Call Back phone  # [phone]  Permission to leave phone message Yes  Some recent data might be hidden     Patient questions:  Do you have a fever, pain , or abdominal swelling? No. Pain Score  0 *  Have you tolerated food without any problems? Yes.    Have you been able to return to your normal activities? Yes.    Do you have any questions about your discharge instructions: Diet   No. Medications  No. Follow up visit  No.  Do you have questions or concerns about your Care? No.  Actions: * If pain score is 4 or above: No action needed, pain <4.  1. Have you developed a fever since your procedure? no  2.   Have you had an respiratory symptoms (SOB or cough) since your procedure? no  3.   Have you tested positive for COVID 19 since your procedure no  4.   Have you had any family members/close contacts diagnosed with the COVID 19 since your procedure?  no   If yes to any of these questions please route to Laverna Peace, RN and Charlett Lango, RN

## 2019-09-28 ENCOUNTER — Encounter: Payer: Self-pay | Admitting: Gastroenterology

## 2019-12-14 ENCOUNTER — Ambulatory Visit (INDEPENDENT_AMBULATORY_CARE_PROVIDER_SITE_OTHER): Payer: 59 | Admitting: Sports Medicine

## 2019-12-14 ENCOUNTER — Encounter: Payer: Self-pay | Admitting: Sports Medicine

## 2019-12-14 ENCOUNTER — Other Ambulatory Visit: Payer: Self-pay

## 2019-12-14 ENCOUNTER — Ambulatory Visit (INDEPENDENT_AMBULATORY_CARE_PROVIDER_SITE_OTHER): Payer: 59

## 2019-12-14 DIAGNOSIS — M25561 Pain in right knee: Secondary | ICD-10-CM | POA: Diagnosis not present

## 2019-12-14 DIAGNOSIS — H0015 Chalazion left lower eyelid: Secondary | ICD-10-CM

## 2019-12-14 DIAGNOSIS — G8929 Other chronic pain: Secondary | ICD-10-CM | POA: Diagnosis not present

## 2019-12-14 MED ORDER — ERYTHROMYCIN 5 MG/GM OP OINT: 1.0000 "application " | TOPICAL_OINTMENT | Freq: Three times a day (TID) | OPHTHALMIC | 0 refills | Status: AC

## 2019-12-14 MED ORDER — DOXYCYCLINE HYCLATE 100 MG PO TABS: 100.0000 mg | ORAL_TABLET | Freq: Two times a day (BID) | ORAL | 0 refills | Status: AC

## 2019-12-14 MED ORDER — MELOXICAM 15 MG PO TABS: ORAL_TABLET | ORAL | 3 refills | Status: DC

## 2019-12-14 NOTE — Progress Notes (Signed)
    Procedures performed today:    None.  Independent interpretation of notes and tests performed by another provider:   None.  Brief History, Exam, Impression, and Recommendations:    Chronic pain of right knee This is a pleasant 24 year old male, for years now he said pain in his right knee, after an injury. He did have an MRI that showed what appeared to be an impaction fracture of the medial femoral condyle. We injected his knee back in 2017 and he did well for a little while, unfortunately he is having recurrence of pain. 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 I am also going to put him on light duty in [employer].  Chalazion left lower eyelid I think Tadhg has a left lower eyelid chalazion, I did express some purulence, we cultured this. Adding topical erythromycin and oral doxycycline. Add warm compresses, return to see me in 2 weeks for this.    ___________________________________________ Ihor Austin. Benjamin Stain, M.D., ABFM., CAQSM. Primary Care and Sports Medicine [facility]  Adjunct Instructor of Family Medicine  [employer]

## 2019-12-14 NOTE — Assessment & Plan Note (Addendum)
This is a pleasant 24 year old male, for years now he said pain in his right knee, after an injury. He did have an MRI that showed what appeared to be an impaction fracture of the medial femoral condyle. We injected his knee back in 2017 and he did well for a little while, unfortunately he is having recurrence of pain. 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 I am also going to put him on light duty in the [employer].

## 2019-12-14 NOTE — Patient Instructions (Signed)
Chalazion  A chalazion is a swelling or lump on the eyelid. It can affect the upper eyelid or the lower eyelid. What are the causes? This condition may be caused by:  Long-lasting (chronic) inflammation of the eyelid glands.  A blocked oil gland in the eyelid. What are the signs or symptoms? Symptoms of this condition include:  Swelling of the eyelid. The swelling may spread to areas around the eye.  A hard lump on the eyelid.  Blurry vision. The lump on the eyelid may make it hard to see out of the eye. How is this diagnosed? This condition is diagnosed with an examination of the eye. How is this treated? This condition is treated by applying a warm compress to the eyelid. If the condition does not improve, it may be treated with:  Medicine that is injected into the chalazion by a health care provider.  Surgery.  Medicine that is applied to the eye. Follow these instructions at home: Managing pain and swelling  Apply a warm, moist compress to the eyelid 4-6 times a day for 10-15 minutes at a time. This will help to open any blocked glands and to reduce redness and swelling.  Apply over-the-counter and prescription medicines only as told by your health care provider. General instructions  Do not touch the chalazion.  Do not try to remove the pus. Do not squeeze the chalazion or stick it with a pin or needle.  Do not rub your eyes.  Wash your hands often. Dry your hands with a clean towel.  Keep your face, scalp, and eyebrows clean.  Avoid wearing eye makeup.  If the chalazion does not break open (rupture) on its own, return to your health care provider.  Keep all follow-up appointments as told by your health care provider. This is important. Contact a health care provider if:  Your eyelid has not improved in 4 weeks.  Your eyelid is getting worse.  You have a fever.  The chalazion does not rupture on its own after a month of home treatment. Get help right  away if:  You have pain in your eye.  Your vision changes.  The chalazion becomes painful or red.  The chalazion gets bigger. Summary  A chalazion is a swelling or lump on the upper or lower eyelid.  It may be caused by chronic inflammation or a blocked oil gland.  Apply a warm, moist compress to the eyelid 4-6 times a day for 10-15 minutes at a time.  Keep your face, scalp, and eyebrows clean. This information is not intended to replace advice given to you by your health care provider. Make sure you discuss any questions you have with your health care provider. Document Revised: 10/14/2017 Document Reviewed: 10/14/2017 Elsevier Patient Education  2020 Elsevier Inc.  

## 2019-12-14 NOTE — Assessment & Plan Note (Signed)
I think Zan has a left lower eyelid chalazion, I did express some purulence, we cultured this. Adding topical erythromycin and oral doxycycline. Add warm compresses, return to see me in 2 weeks for this.

## 2019-12-17 LAB — WOUND CULTURE
GRAM STAIN:: NONE SEEN
MICRO NUMBER:: 10792137
RESULT:: NO GROWTH
SPECIMEN QUALITY:: ADEQUATE

## 2019-12-25 ENCOUNTER — Other Ambulatory Visit: Payer: Self-pay

## 2019-12-25 ENCOUNTER — Ambulatory Visit (INDEPENDENT_AMBULATORY_CARE_PROVIDER_SITE_OTHER): Payer: 59

## 2019-12-25 DIAGNOSIS — G8929 Other chronic pain: Secondary | ICD-10-CM | POA: Diagnosis not present

## 2019-12-25 DIAGNOSIS — M25561 Pain in right knee: Secondary | ICD-10-CM

## 2019-12-28 ENCOUNTER — Telehealth: Payer: Self-pay | Admitting: Sports Medicine

## 2019-12-28 ENCOUNTER — Telehealth (INDEPENDENT_AMBULATORY_CARE_PROVIDER_SITE_OTHER): Payer: 59 | Admitting: Sports Medicine

## 2019-12-28 DIAGNOSIS — M25561 Pain in right knee: Secondary | ICD-10-CM | POA: Diagnosis not present

## 2019-12-28 DIAGNOSIS — H0015 Chalazion left lower eyelid: Secondary | ICD-10-CM

## 2019-12-28 DIAGNOSIS — G8929 Other chronic pain: Secondary | ICD-10-CM

## 2019-12-28 NOTE — Telephone Encounter (Signed)
Please get Tommy Tapia approved for viscosupplementation, he does have medial joint space narrowing on his x-rays, he has failed conservative treatment in the form of NSAIDs, steroid injections in the past, we can try for Orthovisc but let me know if any of the agents are preferred and what the preferred specialty mail-order pharmacy would be as well.  Right knee only.

## 2019-12-28 NOTE — Assessment & Plan Note (Signed)
Tommy Tapia has had chronic knee pain for several years now, historically he had an MRI that showed what appeared to be an impaction fracture of the medial femoral condyle, back in 2017 we injected his knee and he did well, he started to have recurrence of pain, at the last visit we get repeat x-rays and an MRI, MRI was normal but there was some medial joint space narrowing all consistent with posttraumatic osteoarthritis, chondral loss, medial femoral condyle. He has not really improved very much with conservative treatment over the past several weeks so we are going to proceed with approval for viscosupplementation. I will likely do a steroid injection at his first viscosupplementation injection on the initial visit. He will continue on light duty with the [employer], he does have an [employer] form that needs to be filled out which I am happy to do without an appointment.

## 2019-12-28 NOTE — Assessment & Plan Note (Signed)
Resolved with antibiotics 

## 2019-12-28 NOTE — Progress Notes (Signed)
   Virtual Visit via WebEx/MyChart   I connected with  Tommy Tapia  on 12/28/19 via WebEx/MyChart/Doximity Video and verified that I am speaking with the correct person using two identifiers.   I discussed the limitations, risks, security and privacy concerns of performing an evaluation and management service by WebEx/MyChart/Doximity Video, including the higher likelihood of inaccurate diagnosis and treatment, and the availability of in person appointments.  We also discussed the likely need of an additional face to face encounter for complete and high quality delivery of care.  I also discussed with the patient that there may be a patient responsible charge related to this service. The patient expressed understanding and wishes to proceed.  Provider location is in medical facility. Patient location is at their home, different from provider location. People involved in care of the patient during this telehealth encounter were myself, my nurse/medical assistant, and my front office/scheduling team member.  Review of Systems: No fevers, chills, night sweats, weight loss, chest pain, or shortness of breath.   Objective Findings:    General: Speaking full sentences, no audible heavy breathing.  Sounds alert and appropriately interactive.  Appears well.  Face symmetric.  Extraocular movements intact.  Pupils equal and round.  No nasal flaring or accessory muscle use visualized.  Independent interpretation of tests performed by another provider:   None.  Brief History, Exam, Impression, and Recommendations:    Chronic pain of right knee Tommy Tapia has had chronic knee pain for several years now, historically he had an MRI that showed what appeared to be an impaction fracture of the medial femoral condyle, back in 2017 we injected his knee and he did well, he started to have recurrence of pain, at the last visit we get repeat x-rays and an MRI, MRI was normal but there was some medial joint  space narrowing all consistent with posttraumatic osteoarthritis, chondral loss, medial femoral condyle. He has not really improved very much with conservative treatment over the past several weeks so we are going to proceed with approval for viscosupplementation. I will likely do a steroid injection at his first viscosupplementation injection on the initial visit. He will continue on light duty with the [employer], he does have an [employer] form that needs to be filled out which I am happy to do without an appointment.  Chalazion left lower eyelid Resolved with antibiotics.   I discussed the above assessment and treatment plan with the patient. The patient was provided an opportunity to ask questions and all were answered. The patient agreed with the plan and demonstrated an understanding of the instructions.   The patient was advised to call back or seek an in-person evaluation if the symptoms worsen or if the condition fails to improve as anticipated.   I provided 30 minutes of face to face and non-face-to-face time during this encounter date, time was needed to gather information, review chart, records, communicate/coordinate with staff remotely, as well as complete documentation.   ___________________________________________ Ihor Austin. Benjamin Stain, M.D., ABFM., CAQSM. Primary Care and Sports Medicine [facility] [facility]  Adjunct Instructor of Family Medicine  [employer]

## 2020-01-01 NOTE — Telephone Encounter (Signed)
Started authorization process for orthovisc and filled out PA form as well. Waiting on Benefits investigation to come back. - CF

## 2020-01-03 NOTE — Telephone Encounter (Signed)
Received Benefits investigation Detail form from Orthovisc. It requires a PA which has been initiated and the form has been faxed to [employer]. Patient will only be required to pay Co[employer] at time of visit if approved. - CF

## 2020-01-09 ENCOUNTER — Telehealth (INDEPENDENT_AMBULATORY_CARE_PROVIDER_SITE_OTHER): Payer: 59 | Admitting: Sports Medicine

## 2020-01-09 DIAGNOSIS — Z20822 Contact with and (suspected) exposure to covid-19: Secondary | ICD-10-CM | POA: Diagnosis not present

## 2020-01-09 NOTE — Assessment & Plan Note (Signed)
I had a long discussion with Tommy Tapia about the Covid vaccine, mechanism, risks, benefits, he is in [employer] and there is no Microbiologist. I advised him to get it at any local pharmacy. My recommendation is certainly one of the mRNA vaccines.

## 2020-01-09 NOTE — Progress Notes (Signed)
   Virtual Visit via WebEx/MyChart   I connected with  Tommy Tapia  on 01/09/20 via WebEx/MyChart/Doximity Video and verified that I am speaking with the correct person using two identifiers.   I discussed the limitations, risks, security and privacy concerns of performing an evaluation and management service by WebEx/MyChart/Doximity Video, including the higher likelihood of inaccurate diagnosis and treatment, and the availability of in person appointments.  We also discussed the likely need of an additional face to face encounter for complete and high quality delivery of care.  I also discussed with the patient that there may be a patient responsible charge related to this service. The patient expressed understanding and wishes to proceed.  Provider location is in medical facility. Patient location is at their home, different from provider location. People involved in care of the patient during this telehealth encounter were myself, my nurse/medical assistant, and my front office/scheduling team member.  Review of Systems: No fevers, chills, night sweats, weight loss, chest pain, or shortness of breath.   Objective Findings:    General: Speaking full sentences, no audible heavy breathing.  Sounds alert and appropriately interactive.  Appears well.  Face symmetric.  Extraocular movements intact.  Pupils equal and round.  No nasal flaring or accessory muscle use visualized.  Independent interpretation of tests performed by another provider:   None.  Brief History, Exam, Impression, and Recommendations:    Exposure to COVID-19 virus I had a long discussion with Tommy Tapia about the Covid vaccine, mechanism, risks, benefits, he is in the [employer] and there is no Microbiologist. I advised him to get it at any local pharmacy. My recommendation is certainly one of the mRNA vaccines.   I discussed the above assessment and treatment plan with the patient. The patient was provided an  opportunity to ask questions and all were answered. The patient agreed with the plan and demonstrated an understanding of the instructions.   The patient was advised to call back or seek an in-person evaluation if the symptoms worsen or if the condition fails to improve as anticipated.   I provided 30 minutes of face to face and non-face-to-face time during this encounter date, time was needed to gather information, review chart, records, communicate/coordinate with staff remotely, as well as complete documentation.   ___________________________________________ Ihor Austin. Benjamin Stain, M.D., ABFM., CAQSM. Primary Care and Sports Medicine [place] [facility]  Adjunct Instructor of Family Medicine  [employer]

## 2020-01-17 NOTE — Telephone Encounter (Signed)
Received fax from [facility] and they approved Orthovisc.   Case number [id number] Valid: 01/08/20 - 01/06/21  I am giving information to Shanda Bumps to call and scheduled for injections.   Provider Buy and Annette Stable - CF

## 2020-02-05 ENCOUNTER — Other Ambulatory Visit: Payer: Self-pay

## 2020-02-05 ENCOUNTER — Ambulatory Visit (INDEPENDENT_AMBULATORY_CARE_PROVIDER_SITE_OTHER): Payer: 59 | Admitting: Sports Medicine

## 2020-02-05 ENCOUNTER — Ambulatory Visit (INDEPENDENT_AMBULATORY_CARE_PROVIDER_SITE_OTHER): Payer: 59

## 2020-02-05 DIAGNOSIS — G8929 Other chronic pain: Secondary | ICD-10-CM

## 2020-02-05 DIAGNOSIS — M25561 Pain in right knee: Secondary | ICD-10-CM

## 2020-02-05 NOTE — Assessment & Plan Note (Signed)
Tommy Tapia returns, he is a pleasant 24 year old male, his x-rays did show medial joint space narrowing, MRI was for the most part unremarkable. Because of the medial joint space narrowing we got him approved for Orthovisc, Orthovisc No. 1 of 4 into the right knee today, return in 1 week for #2 of 4.

## 2020-02-05 NOTE — Progress Notes (Signed)
    Procedures performed today:    Procedure: Real-time Ultrasound Guided injection of the right knee Device: Samsung HS60  Verbal informed consent obtained.  Time-out conducted.  Noted no overlying erythema, induration, or other signs of local infection.  Skin prepped in a sterile fashion.  Local anesthesia: Topical Ethyl chloride.  With sterile technique and under real time ultrasound guidance:  30 mg/2 mL of OrthoVisc (sodium hyaluronate) in a prefilled syringe was injected easily into the knee through a 22-gauge needle.   Completed without difficulty  Pain immediately resolved suggesting accurate placement of the medication.  Advised to call if fevers/chills, erythema, induration, drainage, or persistent bleeding.  Images permanently stored and available for review in PACS.  Impression: Technically successful ultrasound guided injection.  Independent interpretation of notes and tests performed by another provider:   None.  Brief History, Exam, Impression, and Recommendations:    Chronic pain of right knee Tommy Tapia returns, he is a pleasant 24 year old male, his x-rays did show medial joint space narrowing, MRI was for the most part unremarkable. Because of the medial joint space narrowing we got him approved for Orthovisc, Orthovisc No. 1 of 4 into the right knee today, return in 1 week for #2 of 4.    ___________________________________________ Tommy Tapia. Tommy Tapia, M.D., ABFM., CAQSM. Primary Care and Sports Medicine  [facility]  Adjunct Instructor of Family Medicine  [employer]

## 2020-02-12 ENCOUNTER — Ambulatory Visit (INDEPENDENT_AMBULATORY_CARE_PROVIDER_SITE_OTHER): Payer: 59 | Admitting: Sports Medicine

## 2020-02-12 ENCOUNTER — Other Ambulatory Visit: Payer: Self-pay

## 2020-02-12 ENCOUNTER — Ambulatory Visit (INDEPENDENT_AMBULATORY_CARE_PROVIDER_SITE_OTHER): Payer: 59

## 2020-02-12 DIAGNOSIS — G8929 Other chronic pain: Secondary | ICD-10-CM

## 2020-02-12 DIAGNOSIS — M25561 Pain in right knee: Secondary | ICD-10-CM

## 2020-02-12 NOTE — Assessment & Plan Note (Signed)
Tommy Tapia returns, he is a pleasant 24 year old male, his x-rays did show medial joint space narrowing, MRI was for the most part unremarkable. Because of the medial joint space narrowing we got him approved for Orthovisc, Orthovisc No. 2 of 4 into the right knee today, return in 1 week for #3 of 4.

## 2020-02-12 NOTE — Progress Notes (Signed)
    Procedures performed today:    Procedure: Real-time Ultrasound Guided injection of the right knee Device: Samsung HS60  Verbal informed consent obtained.  Time-out conducted.  Noted no overlying erythema, induration, or other signs of local infection.  Skin prepped in a sterile fashion.  Local anesthesia: Topical Ethyl chloride.  With sterile technique and under real time ultrasound guidance:  30 mg/2 mL of OrthoVisc (sodium hyaluronate) in a prefilled syringe was injected easily into the knee through a 22-gauge needle.   Completed without difficulty  Pain immediately resolved suggesting accurate placement of the medication.  Advised to call if fevers/chills, erythema, induration, drainage, or persistent bleeding.  Images permanently stored and available for review in PACS.  Impression: Technically successful ultrasound guided injection.  Independent interpretation of notes and tests performed by another provider:   None.  Brief History, Exam, Impression, and Recommendations:    Chronic pain of right knee Chia returns, he is a pleasant 24 year old male, his x-rays did show medial joint space narrowing, MRI was for the most part unremarkable. Because of the medial joint space narrowing we got him approved for Orthovisc, Orthovisc No. 2 of 4 into the right knee today, return in 1 week for #3 of 4.    ___________________________________________ Ihor Austin. Benjamin Stain, M.D., ABFM., CAQSM. Primary Care and Sports Medicine [place] [facility]  Adjunct Instructor of Family Medicine  [employer]

## 2020-02-19 ENCOUNTER — Ambulatory Visit (INDEPENDENT_AMBULATORY_CARE_PROVIDER_SITE_OTHER): Payer: 59

## 2020-02-19 ENCOUNTER — Other Ambulatory Visit: Payer: Self-pay

## 2020-02-19 ENCOUNTER — Ambulatory Visit (INDEPENDENT_AMBULATORY_CARE_PROVIDER_SITE_OTHER): Payer: 59 | Admitting: Sports Medicine

## 2020-02-19 DIAGNOSIS — M25561 Pain in right knee: Secondary | ICD-10-CM | POA: Diagnosis not present

## 2020-02-19 DIAGNOSIS — G8929 Other chronic pain: Secondary | ICD-10-CM

## 2020-02-19 NOTE — Progress Notes (Signed)
    Procedures performed today:    Procedure: Real-time Ultrasound Guided injection of the right knee Device: Samsung HS60  Verbal informed consent obtained.  Time-out conducted.  Noted no overlying erythema, induration, or other signs of local infection.  Skin prepped in a sterile fashion.  Local anesthesia: Topical Ethyl chloride.  With sterile technique and under real time ultrasound guidance:  30 mg/2 mL of OrthoVisc (sodium hyaluronate) in a prefilled syringe was injected easily into the knee through a 22-gauge needle.   Completed without difficulty  Pain immediately resolved suggesting accurate placement of the medication.  Advised to call if fevers/chills, erythema, induration, drainage, or persistent bleeding.  Images permanently stored and available for review in PACS.  Impression: Technically successful ultrasound guided injection.  Independent interpretation of notes and tests performed by another provider:   None.  Brief History, Exam, Impression, and Recommendations:    Chronic pain of right knee This is a pleasant 24 year old male here for Orthovisc No. 3 of 4, return in 1 week for #4 of 4, still having some discomfort.    ___________________________________________ Ihor Austin. Benjamin Stain, M.D., ABFM., CAQSM. Primary Care and Sports Medicine [place] [facility]  Adjunct Instructor of Family Medicine  [employer]

## 2020-02-19 NOTE — Assessment & Plan Note (Signed)
This is a pleasant 24 year old male here for Orthovisc No. 3 of 4, return in 1 week for #4 of 4, still having some discomfort.

## 2020-02-26 ENCOUNTER — Ambulatory Visit (INDEPENDENT_AMBULATORY_CARE_PROVIDER_SITE_OTHER): Payer: 59

## 2020-02-26 ENCOUNTER — Ambulatory Visit (INDEPENDENT_AMBULATORY_CARE_PROVIDER_SITE_OTHER): Payer: 59 | Admitting: Sports Medicine

## 2020-02-26 DIAGNOSIS — G8929 Other chronic pain: Secondary | ICD-10-CM | POA: Diagnosis not present

## 2020-02-26 DIAGNOSIS — M25561 Pain in right knee: Secondary | ICD-10-CM

## 2020-02-26 NOTE — Progress Notes (Signed)
    Procedures performed today:    Procedure: Real-time Ultrasound Guidedinjection of the right knee Device: Samsung HS60  Verbal informed consent obtained.  Time-out conducted.  Noted no overlying erythema, induration, or other signs of local infection.  Skin prepped in a sterile fashion.  Local anesthesia: Topical Ethyl chloride.  With sterile technique and under real time ultrasound guidance: 30 mg/2 mL of OrthoVisc (sodium hyaluronate) in a prefilled syringe was injected easily into the knee through a 22-gauge needle.   Completed without difficulty  Pain immediately resolved suggesting accurate placement of the medication.  Advised to call if fevers/chills, erythema, induration, drainage, or persistent bleeding.  Images permanently stored and available for review in PACS.  Impression: Technically successful ultrasound guided injection.  Independent interpretation of notes and tests performed by another provider:   None.  Brief History, Exam, Impression, and Recommendations:    Chronic pain of right knee Orthovisc No. 4 of 4 right knee, return in a month as needed.    ___________________________________________ Ihor Austin. Benjamin Stain, M.D., ABFM., CAQSM. Primary Care and Sports Medicine [place] [facility]  Adjunct Instructor of Family Medicine  [employer]

## 2020-02-26 NOTE — Assessment & Plan Note (Signed)
Orthovisc No. 4 of 4 right knee, return in a month as needed.

## 2020-05-22 NOTE — Telephone Encounter (Signed)
Very smart asking about the Pepto-Bismol!

## 2020-09-04 DIAGNOSIS — J302 Other seasonal allergic rhinitis: Secondary | ICD-10-CM | POA: Diagnosis not present

## 2020-09-05 MED ORDER — MONTELUKAST SODIUM 10 MG PO TABS: 10.0000 mg | ORAL_TABLET | Freq: Every day | ORAL | 3 refills | Status: DC

## 2020-09-05 NOTE — Telephone Encounter (Signed)
I spent 5 total minutes of online digital evaluation and management services. 

## 2020-10-08 ENCOUNTER — Encounter (INDEPENDENT_AMBULATORY_CARE_PROVIDER_SITE_OTHER): Payer: 59

## 2020-10-08 DIAGNOSIS — Z20818 Contact with and (suspected) exposure to other bacterial communicable diseases: Secondary | ICD-10-CM | POA: Insufficient documentation

## 2020-10-08 MED ORDER — AZITHROMYCIN 250 MG PO TABS: ORAL_TABLET | ORAL | 0 refills | Status: AC

## 2020-10-08 NOTE — Assessment & Plan Note (Signed)
Penicillin allergic, prophylacting with azithromycin.

## 2020-10-08 NOTE — Telephone Encounter (Signed)
I spent 5 total minutes of online digital evaluation and management services. 

## 2020-10-10 ENCOUNTER — Telehealth (INDEPENDENT_AMBULATORY_CARE_PROVIDER_SITE_OTHER): Payer: 59 | Admitting: Sports Medicine

## 2020-10-10 DIAGNOSIS — Z20818 Contact with and (suspected) exposure to other bacterial communicable diseases: Secondary | ICD-10-CM | POA: Diagnosis not present

## 2020-10-10 NOTE — Assessment & Plan Note (Signed)
This was a short visit today, Tommy Tapia's wife tested positive for streptococcal tonsillopharyngitis, she is being treated, she is very symptomatic. He has certainly been exposed but has no symptoms, he is speaking full sentences, throat looks clear, we prophylacted him with azithromycin which she will start today. He is penicillin allergic. Return to see me as needed.

## 2020-10-10 NOTE — Progress Notes (Signed)
   Virtual Visit via WebEx/MyChart   I connected with  Tommy Tapia  on 10/10/20 via WebEx/MyChart/Doximity Video and verified that I am speaking with the correct person using two identifiers.   I discussed the limitations, risks, security and privacy concerns of performing an evaluation and management service by WebEx/MyChart/Doximity Video, including the higher likelihood of inaccurate diagnosis and treatment, and the availability of in person appointments.  We also discussed the likely need of an additional face to face encounter for complete and high quality delivery of care.  I also discussed with the patient that there may be a patient responsible charge related to this service. The patient expressed understanding and wishes to proceed.  Provider location is in medical facility. Patient location is at their home, different from provider location. People involved in care of the patient during this telehealth encounter were myself, my nurse/medical assistant, and my front office/scheduling team member.  Review of Systems: No fevers, chills, night sweats, weight loss, chest pain, or shortness of breath.   Objective Findings:    General: Speaking full sentences, no audible heavy breathing.  Sounds alert and appropriately interactive.  Appears well.  Face symmetric.  Extraocular movements intact.  Pupils equal and round.  No nasal flaring or accessory muscle use visualized.  Independent interpretation of tests performed by another provider:   None.  Brief History, Exam, Impression, and Recommendations:    Exposure to Streptococcal pharyngitis This was a short visit today, Tommy Tapia's wife tested positive for streptococcal tonsillopharyngitis, she is being treated, she is very symptomatic. He has certainly been exposed but has no symptoms, he is speaking full sentences, throat looks clear, we prophylacted him with azithromycin which she will start today. He is penicillin  allergic. Return to see me as needed.   I discussed the above assessment and treatment plan with the patient. The patient was provided an opportunity to ask questions and all were answered. The patient agreed with the plan and demonstrated an understanding of the instructions.   The patient was advised to call back or seek an in-person evaluation if the symptoms worsen or if the condition fails to improve as anticipated.   I provided 15 minutes of face to face and non-face-to-face time during this encounter date, time was needed to gather information, review chart, records, communicate/coordinate with staff remotely, as well as complete documentation.   ___________________________________________ Ihor Austin. Benjamin Stain, M.D., ABFM., CAQSM. Primary Care and Sports Medicine [facility]  Adjunct Instructor of Family Medicine  [employer]

## 2020-10-10 NOTE — Progress Notes (Signed)
Wife got strep last weekend with COVID negative test. Patient is asymptomatic but has to take a work trip that he cannot miss.

## 2021-06-13 ENCOUNTER — Encounter: Payer: Self-pay | Admitting: Sports Medicine

## 2021-08-28 ENCOUNTER — Encounter: Payer: Self-pay | Admitting: Sports Medicine

## 2021-08-28 ENCOUNTER — Telehealth (INDEPENDENT_AMBULATORY_CARE_PROVIDER_SITE_OTHER): Payer: Self-pay | Admitting: Sports Medicine

## 2021-08-28 DIAGNOSIS — G8929 Other chronic pain: Secondary | ICD-10-CM

## 2021-08-28 DIAGNOSIS — M25561 Pain in right knee: Secondary | ICD-10-CM

## 2021-08-28 MED ORDER — MELOXICAM 15 MG PO TABS: ORAL_TABLET | ORAL | 3 refills | Status: DC

## 2021-08-28 NOTE — Progress Notes (Signed)
? ?  Virtual Visit via WebEx/MyChart ?  ?I connected with  Reinhardt Licausi  on 08/28/21 via WebEx/MyChart/Doximity Video and verified that I am speaking with the correct person using two identifiers. ?  ?I discussed the limitations, risks, security and privacy concerns of performing an evaluation and management service by WebEx/MyChart/Doximity Video, including the higher likelihood of inaccurate diagnosis and treatment, and the availability of in person appointments.  We also discussed the likely need of an additional face to face encounter for complete and high quality delivery of care.  I also discussed with the patient that there may be a patient responsible charge related to this service. The patient expressed understanding and wishes to proceed. ? ?Provider location is in medical facility. ?Patient location is at their home, different from provider location. ?People involved in care of the patient during this telehealth encounter were myself, my nurse/medical assistant, and my front office/scheduling team member. ? ?Review of Systems: No fevers, chills, night sweats, weight loss, chest pain, or shortness of breath.  ? ?Objective Findings:   ? ?General: Speaking full sentences, no audible heavy breathing.  Sounds alert and appropriately interactive.  Appears well.  Face symmetric.  Extraocular movements intact.  Pupils equal and round.  No nasal flaring or accessory muscle use visualized. ? ?Independent interpretation of tests performed by another provider:  ? ?None. ? ?Brief History, Exam, Impression, and Recommendations:   ? ?Chronic pain of right knee ?Brodi is a pleasant 26 year old male, he is active duty, he has chronic right knee pain with some degenerative changes on previous x-rays, back in 2021 we did a series of Orthovisc and he did really well. ?Unfortunately working out more, running and having a worsening of pain, anterior. ?He is interested in doing another course of Orthovisc but is running  into some trouble with regards to getting it paid with me for rather than seeing a [place] provider. ?I would agree that we need to try another course of Orthovisc, I will go ahead and order the updated x-rays, give him some meloxicam and he will continue his conditioning and he will work on trying to figure out if he needs a referral from a [place] provider to see me. ?In addition he will need me to fill out another functional capacity evaluation, we filled when out and it is available in the media tab, there is an updated form that we can simply transfer the information to. ? ?I discussed the above assessment and treatment plan with the patient. The patient was provided an opportunity to ask questions and all were answered. The patient agreed with the plan and demonstrated an understanding of the instructions. ?  ?The patient was advised to call back or seek an in-person evaluation if the symptoms worsen or if the condition fails to improve as anticipated. ?  ?I provided 30 minutes of face to face and non-face-to-face time during this encounter date, time was needed to gather information, review chart, records, communicate/coordinate with staff remotely, as well as complete documentation. ? ? ?___________________________________________ ?Ihor Austin. Benjamin Stain, M.D., ABFM., CAQSM. ?Primary Care and Sports Medicine ?[name] ? ?Adjunct Instructor of Family Medicine  ?[employer] ? ?

## 2021-08-28 NOTE — Assessment & Plan Note (Addendum)
Tommy Tapia is a pleasant 26 year old male, he is active duty, he has chronic right knee pain with some degenerative changes on previous x-rays, back in 2021 we did a series of Orthovisc and he did really well. ?Unfortunately working out more, running and having a worsening of pain, anterior. ?He is interested in doing another course of Orthovisc but is running into some trouble with regards to getting it paid with me for rather than seeing a [place] provider. ?I would agree that we need to try another course of Orthovisc, I will go ahead and order the updated x-rays, give him some meloxicam and he will continue his conditioning and he will work on trying to figure out if he needs a referral from a [place] provider to see me. ?In addition he will need me to fill out another functional capacity evaluation, we filled when out and it is available in the media tab, there is an updated form that we can simply transfer the information to. ?

## 2021-08-28 NOTE — Progress Notes (Signed)
Right knee; working out more and running getting worse again.  ?

## 2021-09-01 DIAGNOSIS — H16422 Pannus (corneal), left eye: Secondary | ICD-10-CM | POA: Insufficient documentation

## 2021-09-01 DIAGNOSIS — H52223 Regular astigmatism, bilateral: Secondary | ICD-10-CM | POA: Insufficient documentation

## 2021-09-01 DIAGNOSIS — H35413 Lattice degeneration of retina, bilateral: Secondary | ICD-10-CM | POA: Insufficient documentation

## 2021-09-01 DIAGNOSIS — H17822 Peripheral opacity of cornea, left eye: Secondary | ICD-10-CM | POA: Insufficient documentation

## 2021-09-01 DIAGNOSIS — H5213 Myopia, bilateral: Secondary | ICD-10-CM | POA: Insufficient documentation

## 2021-10-24 IMAGING — DX DG KNEE 1-2V*L*
2 series · 2 of 2 positions shown · non-contrast
Comparison: 12/26/2014

CLINICAL DATA: For use with right knee x-ray. Chronic right knee
pain.

EXAM:
LEFT KNEE - 1-2 VIEW

[tunnel]
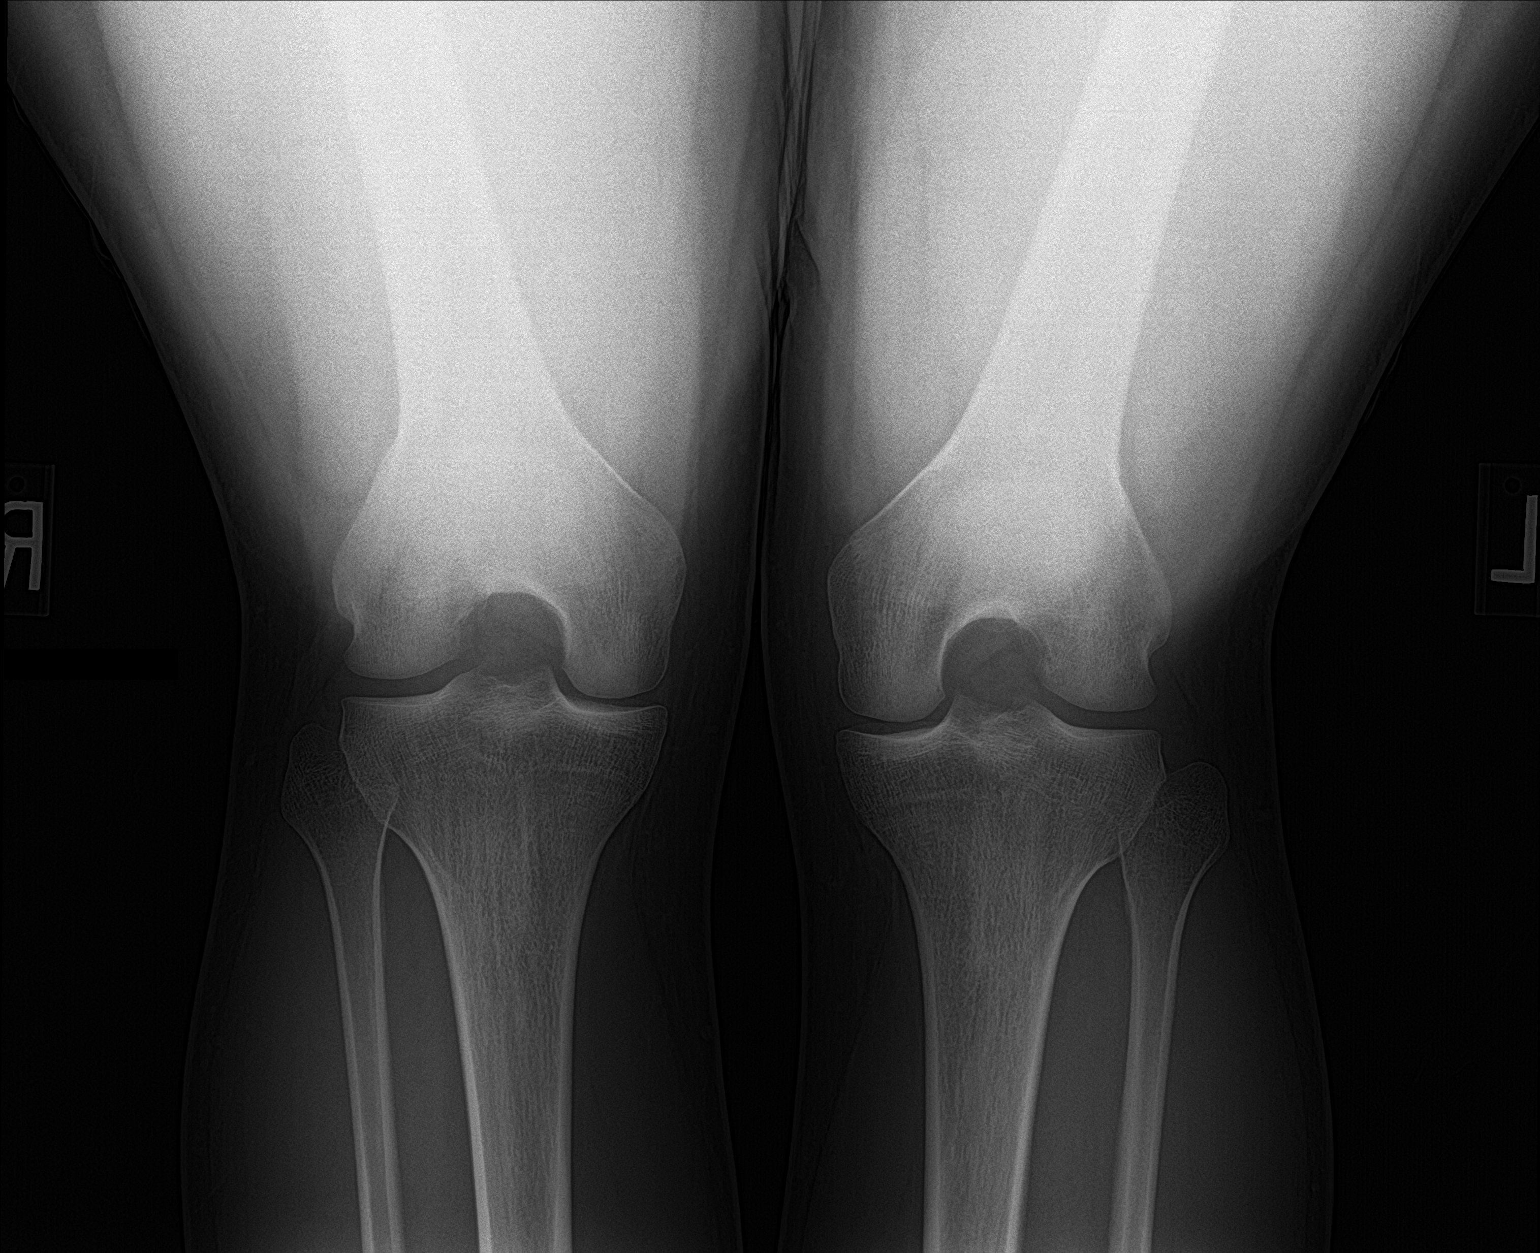

[knee ap bilat standing]
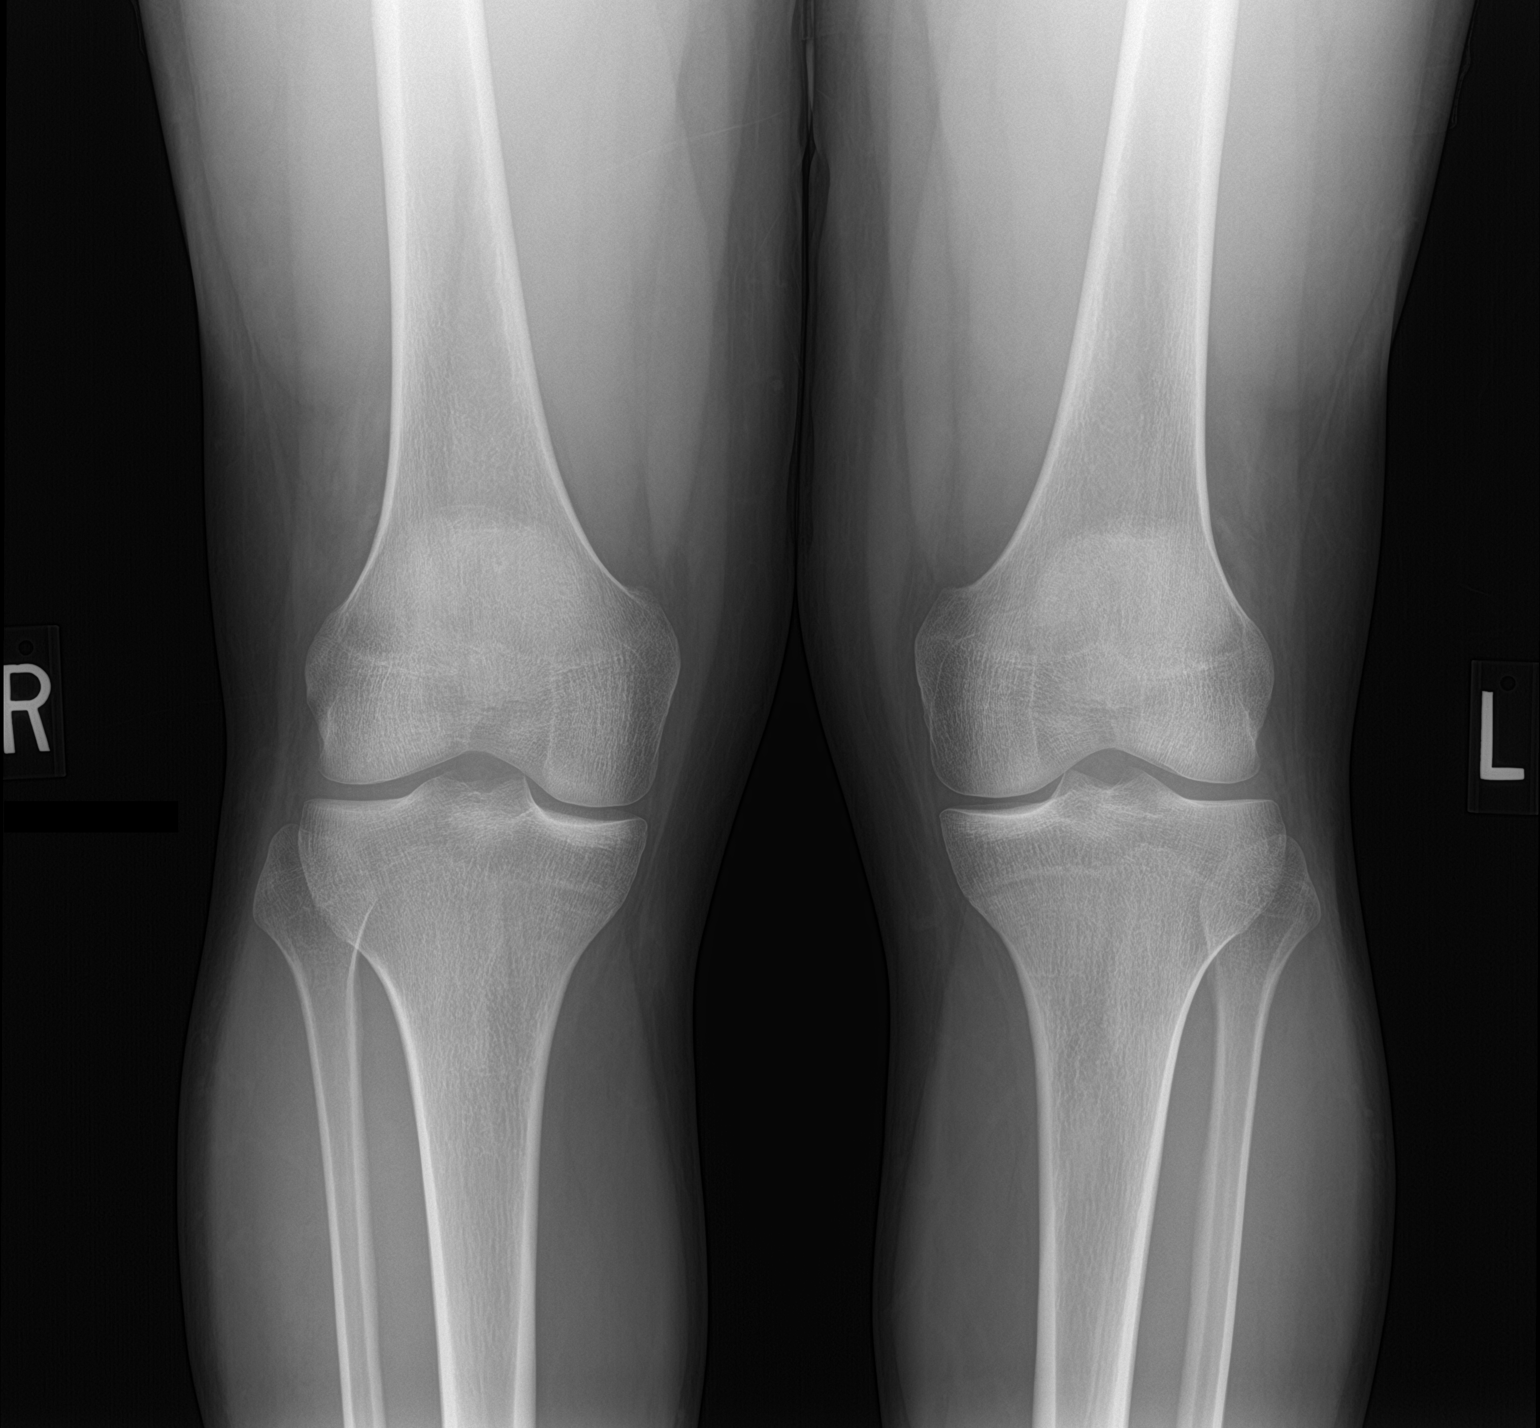

[2 of 2 positions shown; findings below may reference images not displayed]

FINDINGS: AP and tunnel standing views of both knees are obtained. The joint
spaces are preserved. Alignment is normal. No focal osseous
abnormality of the left knee.
IMPRESSION: Unremarkable frontal views of the left knee.

## 2021-10-24 IMAGING — DX DG KNEE COMPLETE 4+V*R*
4 series · 4 of 4 positions shown · non-contrast
Comparison: None.

CLINICAL DATA: Chronic right knee pain since 5380.

EXAM:
RIGHT KNEE - COMPLETE 4+ VIEW

[tunnel]
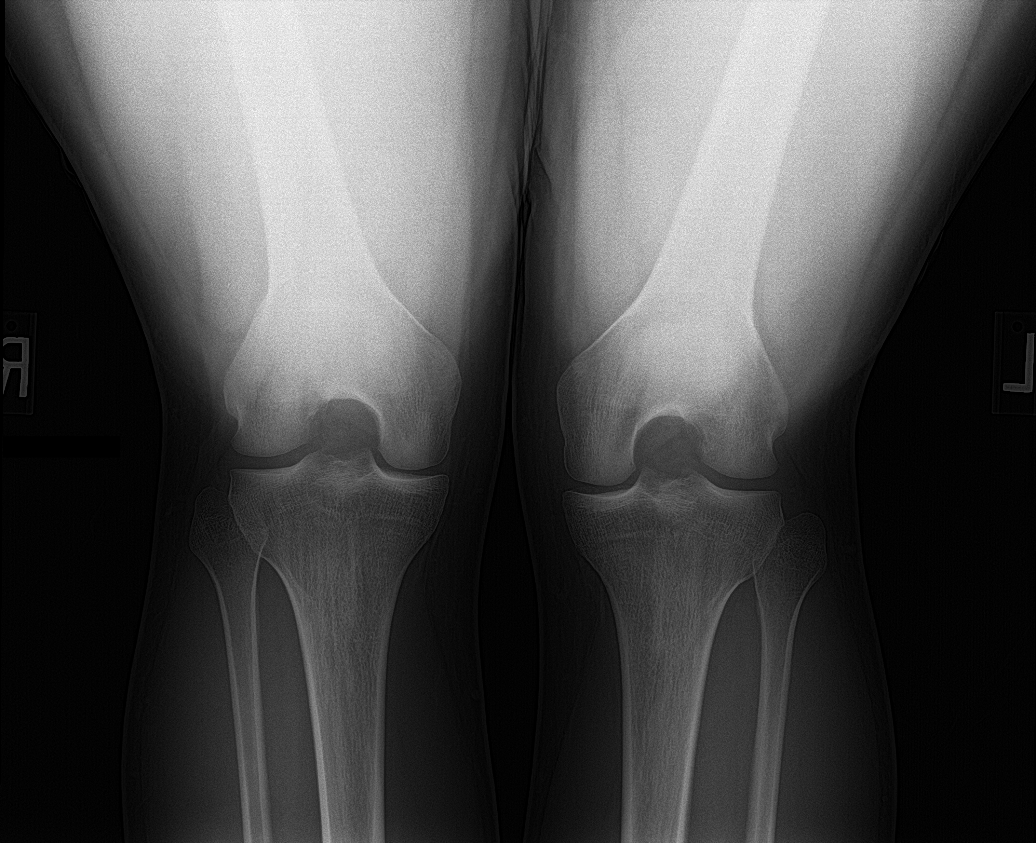

[knee lat]
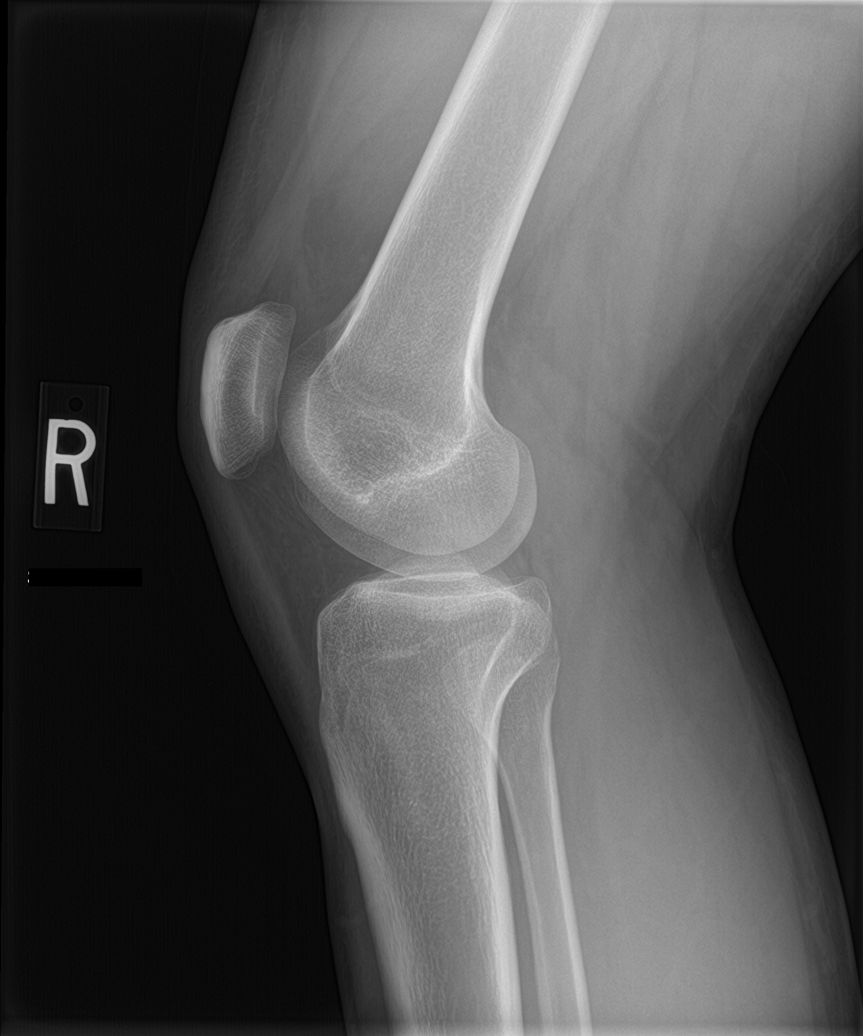

[knee sunrise]
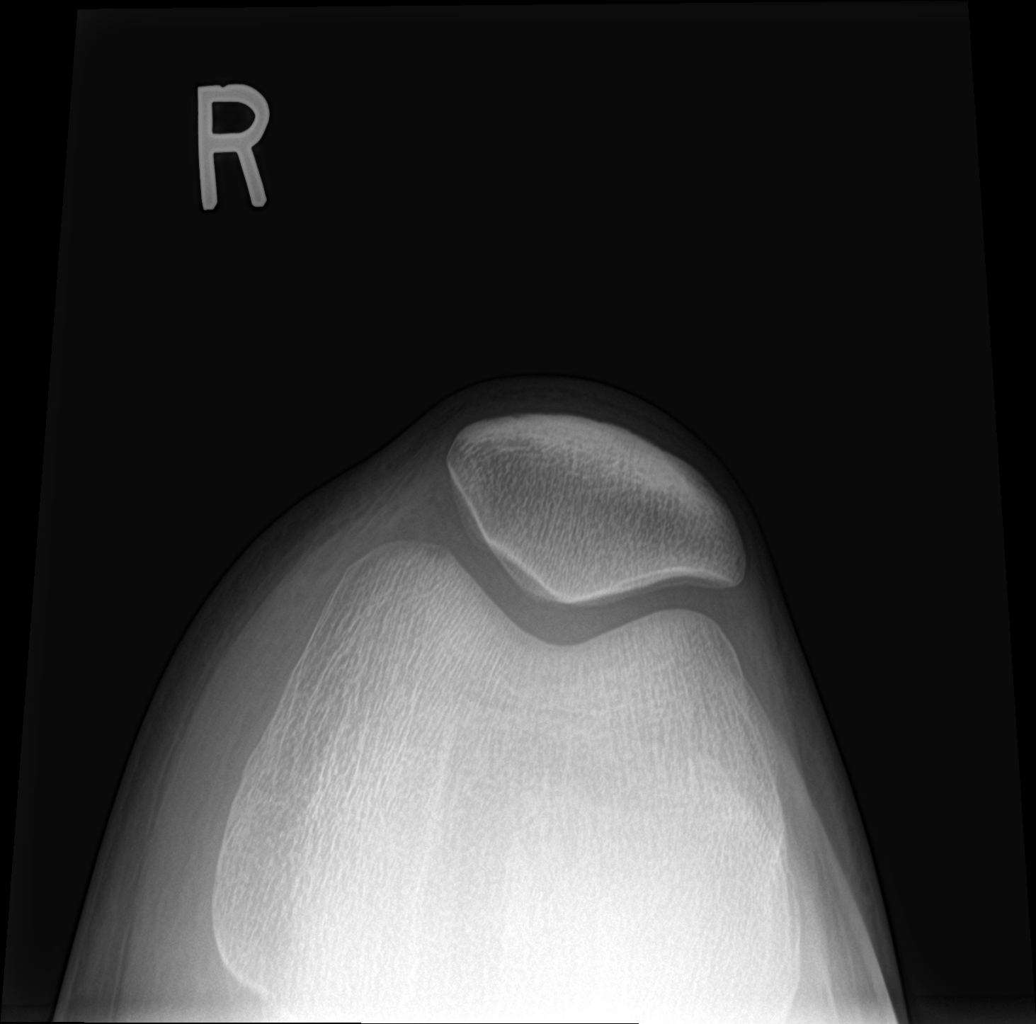

[knee ap bilat standing]
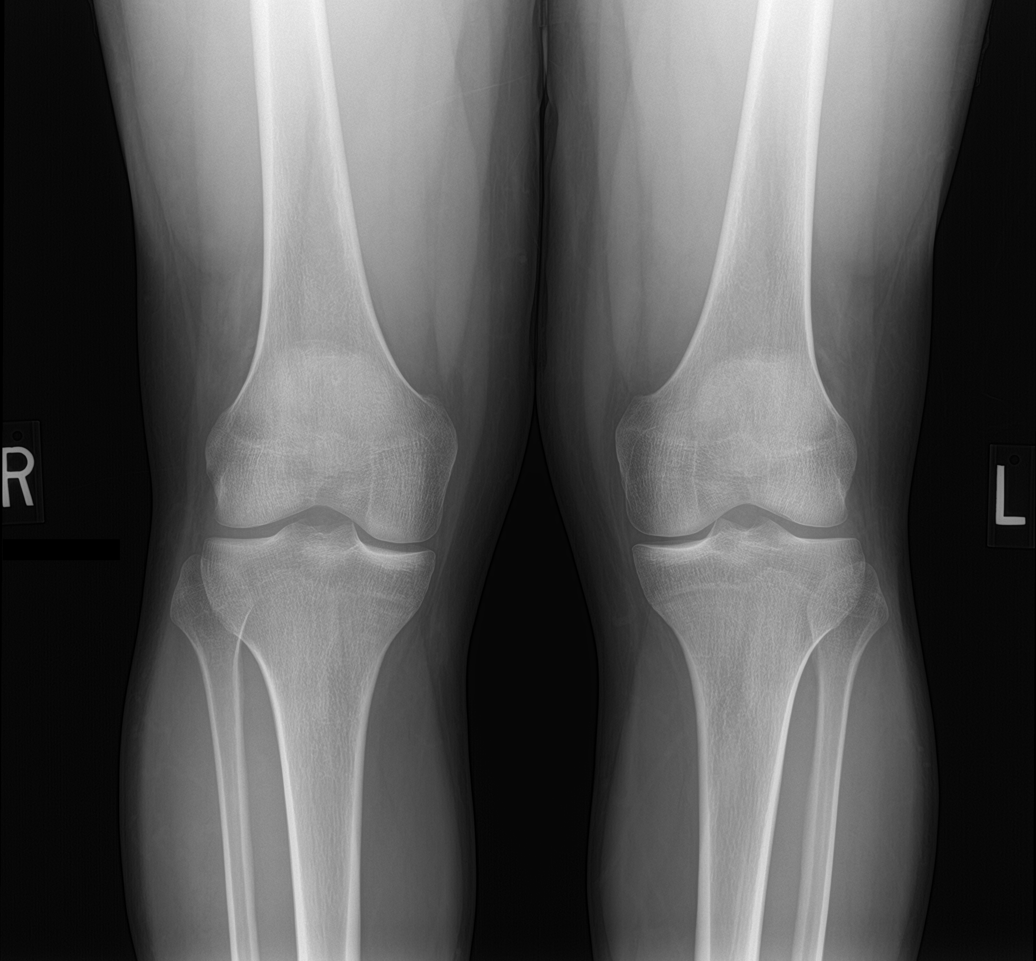

[4 of 4 positions shown; findings below may reference images not displayed]

FINDINGS: There is mild medial joint space narrowing on the right. There is
minimal medial joint space narrowing on the left. There is no acute
displaced fracture. No dislocation. No significant suprapatellar
joint effusion.
IMPRESSION: Mild medial joint space narrowing on the right.

## 2021-11-24 ENCOUNTER — Telehealth: Payer: Self-pay | Admitting: General Practice

## 2021-11-24 NOTE — Telephone Encounter (Signed)
Transition Care Management Follow-up Telephone Call Date of discharge and from where: 11/21/21 from [facility] How have you been since you were released from the hospital? Doing better. Some stiffness and soreness still there. Any questions or concerns? No  Items Reviewed: Did the pt receive and understand the discharge instructions provided? Yes  Medications obtained and verified? Yes  Other? No  Any new allergies since your discharge? No  Dietary orders reviewed? Yes Do you have support at home? Yes   Home Care and Equipment/Supplies: Were home health services ordered? no  Functional Questionnaire: (I = Independent and D = Dependent) ADLs: I  Bathing/Dressing- I  Meal Prep- I  Eating- I  Maintaining continence- I  Transferring/Ambulation- I  Managing Meds- I  Follow up appointments reviewed:  PCP Hospital f/u appt confirmed? No  Patient will call back to schedule. Specialist Hospital f/u appt confirmed? No   Are transportation arrangements needed? No  If their condition worsens, is the pt aware to call PCP or go to the Emergency Dept.? Yes Was the patient provided with contact information for the PCP's office or ED? Yes Was to pt encouraged to call back with questions or concerns? Yes

## 2022-12-16 DIAGNOSIS — S46012A Strain of muscle(s) and tendon(s) of the rotator cuff of left shoulder, initial encounter: Secondary | ICD-10-CM | POA: Insufficient documentation

## 2023-05-12 ENCOUNTER — Encounter: Payer: Self-pay | Admitting: Emergency Medicine

## 2023-05-12 ENCOUNTER — Ambulatory Visit
Admission: EM | Admit: 2023-05-12 | Discharge: 2023-05-12 | Disposition: A | Discharge status: Home / Self Care | Attending: Internal Medicine | Admitting: Internal Medicine

## 2023-05-12 DIAGNOSIS — J029 Acute pharyngitis, unspecified: Secondary | ICD-10-CM

## 2023-05-12 DIAGNOSIS — J040 Acute laryngitis: Secondary | ICD-10-CM | POA: Diagnosis not present

## 2023-05-12 LAB — POCT RAPID STREP A (OFFICE): Rapid Strep A Screen: NEGATIVE

## 2023-05-12 NOTE — Discharge Instructions (Addendum)
 Your strep test was negative. Recommend salt water gargling and over-the-counter Ibuprofen /Tylenol  as needed for pain if you are not allergic. Return in 3 to 4 days if no improvement. Please go directly to the Emergency Department immediately should you begin to have any of the following symptoms: increased pain, persistent fevers, difficulty swallowing, difficulty talking, drooling or difficulty breathing.

## 2023-05-12 NOTE — ED Provider Notes (Signed)
 [facility]  Note:  This document was prepared using Dragon voice recognition software and may include unintentional dictation errors.  MRN: [id number] DOB: 1996-02-22 DATE: 05/12/23   Subjective:  Chief Complaint:  Chief Complaint  Patient presents with   Hoarse   Sore Throat     HPI: Tommy Tapia is a 28 y.o. male presenting for dry, scratchy throat and hoarseness for less than one day. Patient states his throat has felt dry the past 2 days and he took honey last night before he went to bed. He states he woke up this morning and was hoarse and had more discomfort. He took Advil  cold and flu with no improvement. He reports sick contact at home. Denies fever, nausea/vomiting, abdominal pain, cough, congestion, otalgia. Endorses sore throat, hoarseness. Presents NAD.  Prior to Admission medications   Medication Sig Start Date End Date Taking? Authorizing Provider  meloxicam  (MOBIC ) 15 MG tablet One tab PO every 24 hours with a meal for 2 weeks, then once every 24 hours prn pain. 08/28/21   Curtis Debby PARAS, MD     Allergies  Allergen Reactions   Augmentin  [Amoxicillin -Pot Clavulanate] Diarrhea   Oseltamivir  Other (See Comments)    Hallucination Hallucination   Tamiflu  [Oseltamivir  Phosphate] Other (See Comments)    Hallucination   Other     MEDICATION DYES    History:   Past Medical History:  Diagnosis Date   GERD (gastroesophageal reflux disease)      Past Surgical History:  Procedure Laterality Date   WISDOM TOOTH EXTRACTION      Family History  Problem Relation Age of Onset   Colon cancer Neg Hx    Colon polyps Neg Hx    Esophageal cancer Neg Hx    Stomach cancer Neg Hx     Social History   Tobacco Use   Smoking status: Never   Smokeless tobacco: Never  Vaping Use   Vaping status: Never Used  Substance Use Topics   Alcohol use: No    Alcohol/week: 0.0 standard drinks of alcohol   Drug use: Not Currently    Review of Systems   Constitutional:  Negative for fever.  HENT:  Positive for sore throat and voice change. Negative for congestion and ear pain.   Respiratory:  Negative for cough.   Gastrointestinal:  Negative for abdominal pain, nausea and vomiting.     Objective:   Vitals: BP (!) 159/79 (BP Location: Right Arm)   Pulse 67   Temp 97.9 F (36.6 C) (Oral)   Resp 16   SpO2 95%   Physical Exam Constitutional:      General: He is not in acute distress.    Appearance: Normal appearance. He is well-developed and normal weight. He is not ill-appearing or toxic-appearing.  HENT:     Head: Normocephalic and atraumatic.     Right Ear: Tympanic membrane and ear canal normal.     Left Ear: Tympanic membrane and ear canal normal.     Mouth/Throat:     Pharynx: Uvula midline. Posterior oropharyngeal erythema present. No pharyngeal swelling, oropharyngeal exudate or uvula swelling.     Tonsils: No tonsillar exudate or tonsillar abscesses.  Cardiovascular:     Rate and Rhythm: Normal rate and regular rhythm.     Heart sounds: Normal heart sounds.  Pulmonary:     Effort: Pulmonary effort is normal.     Breath sounds: Normal breath sounds.     Comments: Clear to auscultation bilaterally  Abdominal:  General: Bowel sounds are normal.     Palpations: Abdomen is soft.     Tenderness: There is no abdominal tenderness.  Lymphadenopathy:     Cervical: No cervical adenopathy.     Right cervical: No superficial cervical adenopathy.    Left cervical: No superficial cervical adenopathy.  Skin:    General: Skin is warm and dry.  Neurological:     General: No focal deficit present.     Mental Status: He is alert.  Psychiatric:        Mood and Affect: Mood and affect normal.     Results:  Labs: Results for orders placed or performed during the hospital encounter of 05/12/23 (from the past 24 hours)  POCT rapid strep A     Status: None   Collection Time: 05/12/23  1:56 PM  Result Value Ref Range    Rapid Strep A Screen Negative Negative    Radiology: No results found.   UC Course/Treatments:  Procedures: Procedures   Medications Ordered in UC: Medications - No data to display   Assessment and Plan :     ICD-10-CM   1. Laryngitis  J04.0     2. Acute pharyngitis, unspecified etiology  J02.9      Laryngitis Afebrile, nontoxic-appearing, NAD. VSS. DDX includes but not limited to: viral URI, strain, GERD, strep, mono, epiglottitis, post nasal drip Strep was negative today in office. Suspect viral etiology. Recommend rest and salt water gargles. Strict ED precautions were given and patient verbalized understanding.  Acute pharyngitis, unspecified etiology Afebrile, nontoxic-appearing, NAD. VSS. DDX includes but not limited to: strep, mono, viral pharyngitis, post nasal drip Strep was negative today in office. Suspect viral etiology. Recommend salt water gargles and over-the-counter throat spray/lozenges. Strict ED precautions were given and patient verbalized understanding.   ED Discharge Orders     None        PDMP not reviewed this encounter.     Basilia Ulanda SQUIBB, PA-C 05/12/23 1358

## 2023-05-12 NOTE — ED Triage Notes (Signed)
 Pt reports waking up this morning with a dry scratchy throat, trouble swallowing and loss of voice.

## 2023-10-21 ENCOUNTER — Ambulatory Visit: Admission: EM | Admit: 2023-10-21 | Discharge: 2023-10-21 | Disposition: A | Discharge status: Home / Self Care

## 2023-10-21 DIAGNOSIS — T148XXA Other injury of unspecified body region, initial encounter: Secondary | ICD-10-CM

## 2023-10-21 DIAGNOSIS — L089 Local infection of the skin and subcutaneous tissue, unspecified: Secondary | ICD-10-CM

## 2023-10-21 DIAGNOSIS — Z23 Encounter for immunization: Secondary | ICD-10-CM

## 2023-10-21 MED ORDER — TETANUS-DIPHTH-ACELL PERTUSSIS 5-2.5-18.5 LF-MCG/0.5 IM SUSY
0.5000 mL | PREFILLED_SYRINGE | Freq: Once | INTRAMUSCULAR | Status: AC
  Administered 2023-10-21: 0.5 mL via INTRAMUSCULAR

## 2023-10-21 MED ORDER — SULFAMETHOXAZOLE-TRIMETHOPRIM 800-160 MG PO TABS: 1.0000 | ORAL_TABLET | Freq: Two times a day (BID) | ORAL | 0 refills | Status: AC

## 2023-10-21 NOTE — Discharge Instructions (Signed)
 You received your tetanus booster today.  Please begin Bactrim 1 tablet twice daily for the next 7 days for treatment of infection of the wound on your left lower leg.  You are welcome to keep the wound covered when you wear boots, otherwise feel free to leave it open as long as it is not draining.  Please monitor your wound for signs of worsening infection despite treatment.  If these occur, please return for repeat evaluation.  Thank you for visiting [facility] today.  We appreciate the opportunity to participate in your care.

## 2023-10-21 NOTE — ED Provider Notes (Signed)
 [facility]    CSN: [id number] Arrival date & time: 10/21/23  1857    HISTORY   Chief Complaint  Patient presents with   Wound Check   HPI Tommy Tapia is a pleasant, 28 y.o. male who presents to urgent care today. Pt states he was tearing down a fence about 2 to 2.5 weeks ago. States he is unsure what he cuts his left leg on. Pt states lesion started small and then over the past week it has become increasingly red, raised and painful with weightbearing.  States he is unaware if he may have been bitten by an insect or spider, states area was never itchy.  States he has attempted to keep the wound clean with hydrogen peroxide and neosporin but the wound is right at the top of his work-boots and rubs against it constantly. Pt states he is unsure about his tetanus status, per EMR the last Tdap he received was given 08/28/2018.   The history is provided by the patient.  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 Authorizing Provider  albuterol (VENTOLIN HFA)  108 (90 Base) MCG/ACT inhaler Inhale 2 puffs into the lungs. 02/17/22  Yes [provider]  sulfamethoxazole-trimethoprim (BACTRIM DS) 800-160 MG tablet Take 1 tablet by mouth 2 (two) times daily for 7 days. 10/21/23 10/28/23 Yes Eloise Hake Scales, PA-C    Family History Family History  Problem Relation Age of Onset   Colon cancer Neg Hx    Colon polyps Neg Hx    Esophageal cancer Neg Hx    Stomach cancer Neg Hx    Social History Social History   Tobacco Use   Smoking status: Never    Passive exposure: Never   Smokeless tobacco: Never  Vaping Use   Vaping status: Never Used  Substance Use Topics   Alcohol use: No    Alcohol/week: 0.0 standard drinks of alcohol   Drug use: Not Currently   Allergies   Augmentin  [amoxicillin -pot clavulanate], Oseltamivir , Oseltamivir  phosphate, Tamiflu  [oseltamivir  phosphate], and Other  Review of Systems Review of Systems Pertinent findings revealed after performing a 14 point review of systems has been noted in the history of present illness.  Physical Exam Vital Signs BP (!) 141/80 (BP Location: Right Arm)   Pulse 62   Temp 98 F (36.7 C) (Oral)   Resp 18   SpO2 97%   No data found.  Physical Exam Vitals and nursing note reviewed.  Constitutional:      General: He is awake. He is not in  acute distress.    Appearance: Normal appearance. He is well-developed and well-groomed.   Musculoskeletal:       Legs:   Skin:    Findings: Lesion: please see photo below.   Neurological:     Mental Status: He is alert.   Psychiatric:        Behavior: Behavior is cooperative.     Visual Acuity Right Eye Distance:   Left Eye Distance:   Bilateral Distance:    Right Eye Near:   Left Eye Near:    Bilateral Near:     UC Couse / Diagnostics / Procedures:     Radiology No results found.  Procedures Procedures (including critical care time) EKG  Pending results:  Labs Reviewed - No data to  display  Medications Ordered in UC: Medications  Tdap (BOOSTRIX) injection 0.5 mL (0.5 mLs Intramuscular Given 10/21/23 1924)    UC Diagnoses / Final Clinical Impressions(s)   I have reviewed the triage vital signs and the nursing notes.  Pertinent labs & imaging results that were available during my care of the patient were reviewed by me and considered in my medical decision making (see chart for details).    Final diagnoses:  Post-traumatic wound infection  Need for prophylactic vaccination with combined diphtheria-tetanus-pertussis (DTP) vaccine   Patient was provided with a Tdap booster.  Patient was advised to begin Bactrim double strength 1 tab twice daily for the next 7 days for treatment of infection of wound on the lower extremity.  Conservative care recommended.  Return precautions advised.  Please see discharge instructions below for details of plan of care as provided to patient. ED Prescriptions     Medication Sig Dispense Auth. Provider   sulfamethoxazole-trimethoprim (BACTRIM DS) 800-160 MG tablet Take 1 tablet by mouth 2 (two) times daily for 7 days. 14 tablet Eloise Hake Scales, PA-C      PDMP not reviewed this encounter.  Pending results:  Labs Reviewed - No data to display    Discharge Instructions      You received your tetanus booster today.  Please begin Bactrim 1 tablet twice daily for the next 7 days for treatment of infection of the wound on your left lower leg.  You are welcome to keep the wound covered when you wear boots, otherwise feel free to leave it open as long as it is not draining.  Please monitor your wound for signs of worsening infection despite treatment.  If these occur, please return for repeat evaluation.  Thank you for visiting [facility] today.  We appreciate the opportunity to participate in your care.    Disposition Upon Discharge:  Condition: stable for discharge home  Patient presented with an acute  illness with associated systemic symptoms and significant discomfort requiring urgent management. In my opinion, this is a condition that a prudent lay person (someone who possesses an average knowledge of health and medicine) may potentially expect to result in complications if not addressed urgently such as respiratory distress, impairment of bodily function or dysfunction of bodily organs.   Routine symptom specific, illness specific and/or disease specific instructions were discussed with the patient and/or caregiver at length.   As such, the patient has been evaluated and assessed, work-up was performed and treatment was provided in alignment with urgent care protocols and evidence based medicine.  Patient/parent/caregiver has been advised that the patient may require follow up for further testing and treatment if the symptoms continue in spite of treatment, as  clinically indicated and appropriate.  Patient/parent/caregiver has been advised to return to the [facility] or PCP if no better; to PCP or the Emergency Department if new signs and symptoms develop, or if the current signs or symptoms continue to change or worsen for further workup, evaluation and treatment as clinically indicated and appropriate  The patient will follow up with their current PCP if and as advised. If the patient does not currently have a PCP we will assist them in obtaining one.   The patient may need specialty follow up if the symptoms continue, in spite of conservative treatment and management, for further workup, evaluation, consultation and treatment as clinically indicated and appropriate.  Patient/parent/caregiver verbalized understanding and agreement of plan as discussed.  All questions were addressed during visit.  Please see discharge instructions below for further details of plan.  This office note has been dictated using Teaching laboratory technician.  Unfortunately, this method of dictation can sometimes lead  to typographical or grammatical errors.  I apologize for your inconvenience in advance if this occurs.  Please do not hesitate to reach out to me if clarification is needed.      Eloise Hake Scales, PA-C 10/21/23 1935

## 2023-10-21 NOTE — ED Triage Notes (Addendum)
 Pt states he was tearing down a fence about 2 to 2.5 weeks ago. He is unsure what he cuts his left leg on. Pt states it started small and then the past week it has been increasingly red and raised.   Pt states he is unsure about his tetanus status.  Interventions: Peroxide, Neosporin

## 2024-01-11 ENCOUNTER — Encounter: Payer: Self-pay | Admitting: Sports Medicine
